# Patient Record
Sex: Female | Born: 1990 | Race: White | Hispanic: No | Marital: Single | Smoking: Former smoker
Health system: Southern US, Community
[De-identification: ages and names within clinical notes are randomized; demographics above are authoritative.]

## PROBLEM LIST (undated history)

## (undated) ENCOUNTER — Inpatient Hospital Stay (HOSPITAL_COMMUNITY): Payer: Self-pay

## (undated) DIAGNOSIS — N83209 Unspecified ovarian cyst, unspecified side: Secondary | ICD-10-CM

---

## 2013-04-16 NOTE — L&D Delivery Note (Signed)
Delivery Note At 11:15 AM a viable female, "Gwenevere Abbot" was delivered via  (Presentation:ROA;  ).  APGAR: , ; weight  .   Placenta status: Spontaneous, intact.  Cord:  with the following complications: None.  Cord pH: NA  Anesthesia: Epidural  Episiotomy:  NA Lacerations:  1st degree vaginal, 1st degree right inner labial laceration Suture Repair: 3.0 vicryl Est. Blood Loss (mL):  150 by drape graduation  Mom to postpartum.  Baby to Couplet care / Skin to Skin. Patient plans Depo before d/c.  Donnel Saxon 04/10/2014, 11:52 AM

## 2013-08-14 LAB — PREGNANCY, URINE

## 2013-10-29 LAB — OB RESULTS CONSOLE ABO/RH: RH TYPE: NEGATIVE

## 2013-10-29 LAB — OB RESULTS CONSOLE ANTIBODY SCREEN: Antibody Screen: NEGATIVE

## 2013-10-29 LAB — OB RESULTS CONSOLE GC/CHLAMYDIA
CHLAMYDIA, DNA PROBE: POSITIVE
Gonorrhea: NEGATIVE

## 2013-10-29 LAB — OB RESULTS CONSOLE HIV ANTIBODY (ROUTINE TESTING): HIV: NONREACTIVE

## 2013-10-29 LAB — OB RESULTS CONSOLE RUBELLA ANTIBODY, IGM: Rubella: IMMUNE

## 2013-10-29 LAB — OB RESULTS CONSOLE HEPATITIS B SURFACE ANTIGEN: Hepatitis B Surface Ag: NEGATIVE

## 2013-10-29 LAB — OB RESULTS CONSOLE RPR: RPR: NONREACTIVE

## 2013-11-20 ENCOUNTER — Telehealth: Payer: Self-pay | Admitting: Obstetrics and Gynecology

## 2013-11-20 ENCOUNTER — Encounter: Payer: Self-pay | Admitting: *Deleted

## 2013-11-20 ENCOUNTER — Encounter: Payer: Self-pay | Admitting: Nurse Practitioner

## 2013-11-20 NOTE — Telephone Encounter (Signed)
Called patient for a missed appt today. Pt stated that she have tried to cancel this appt via phone but didn't receive a confirmation call. She does not want to re-schedule for she is getting her prenatal care somewhere else.

## 2013-12-31 LAB — OB RESULTS CONSOLE RPR: RPR: NONREACTIVE

## 2014-01-14 LAB — OB RESULTS CONSOLE GC/CHLAMYDIA
CHLAMYDIA, DNA PROBE: NEGATIVE
GC PROBE AMP, GENITAL: NEGATIVE

## 2014-03-10 LAB — OB RESULTS CONSOLE GBS: STREP GROUP B AG: NEGATIVE

## 2014-03-30 ENCOUNTER — Encounter: Payer: Self-pay | Admitting: *Deleted

## 2014-04-09 ENCOUNTER — Inpatient Hospital Stay (HOSPITAL_COMMUNITY)
Admission: AD | Admit: 2014-04-09 | Discharge: 2014-04-09 | Disposition: A | Discharge status: Home / Self Care | Payer: Medicaid Other | Source: Ambulatory Visit | Attending: Obstetrics & Gynecology | Admitting: Obstetrics & Gynecology

## 2014-04-09 ENCOUNTER — Encounter (HOSPITAL_COMMUNITY): Payer: Self-pay | Admitting: *Deleted

## 2014-04-09 DIAGNOSIS — O99214 Obesity complicating childbirth: Principal | ICD-10-CM | POA: Diagnosis present

## 2014-04-09 DIAGNOSIS — E669 Obesity, unspecified: Secondary | ICD-10-CM | POA: Diagnosis present

## 2014-04-09 DIAGNOSIS — IMO0002 Reserved for concepts with insufficient information to code with codable children: Secondary | ICD-10-CM | POA: Diagnosis not present

## 2014-04-09 DIAGNOSIS — O9852 Other viral diseases complicating childbirth: Secondary | ICD-10-CM | POA: Diagnosis present

## 2014-04-09 DIAGNOSIS — Z6832 Body mass index (BMI) 32.0-32.9, adult: Secondary | ICD-10-CM

## 2014-04-09 DIAGNOSIS — O479 False labor, unspecified: Secondary | ICD-10-CM

## 2014-04-09 DIAGNOSIS — B007 Disseminated herpesviral disease: Secondary | ICD-10-CM | POA: Diagnosis present

## 2014-04-09 DIAGNOSIS — A6000 Herpesviral infection of urogenital system, unspecified: Secondary | ICD-10-CM | POA: Diagnosis not present

## 2014-04-09 DIAGNOSIS — Z3A40 40 weeks gestation of pregnancy: Secondary | ICD-10-CM | POA: Diagnosis present

## 2014-04-09 HISTORY — DX: Unspecified ovarian cyst, unspecified side: N83.209

## 2014-04-09 MED ORDER — MORPHINE SULFATE 10 MG/ML IJ SOLN
10.0000 mg | Freq: Once | INTRAMUSCULAR | Status: AC
  Administered 2014-04-09: 10 mg via INTRAMUSCULAR
  Filled 2014-04-09: qty 1

## 2014-04-09 MED ORDER — OXYCODONE-ACETAMINOPHEN 5-325 MG PO TABS: 1.0000 | ORAL_TABLET | Freq: Four times a day (QID) | ORAL | Status: DC | PRN

## 2014-04-09 NOTE — Discharge Instructions (Signed)
Braxton Hicks Contractions °Contractions of the uterus can occur throughout pregnancy. Contractions are not always a sign that you are in labor.  °WHAT ARE BRAXTON HICKS CONTRACTIONS?  °Contractions that occur before labor are called Braxton Hicks contractions, or false labor. Toward the end of pregnancy (32-34 weeks), these contractions can develop more often and may become more forceful. This is not true labor because these contractions do not result in opening (dilatation) and thinning of the cervix. They are sometimes difficult to tell apart from true labor because these contractions can be forceful and people have different pain tolerances. You should not feel embarrassed if you go to the hospital with false labor. Sometimes, the only way to tell if you are in true labor is for your health care provider to look for changes in the cervix. °If there are no prenatal problems or other health problems associated with the pregnancy, it is completely safe to be sent home with false labor and await the onset of true labor. °HOW CAN YOU TELL THE DIFFERENCE BETWEEN TRUE AND FALSE LABOR? °False Labor °· The contractions of false labor are usually shorter and not as hard as those of true labor.   °· The contractions are usually irregular.   °· The contractions are often felt in the front of the lower abdomen and in the groin.   °· The contractions may go away when you walk around or change positions while lying down.   °· The contractions get weaker and are shorter lasting as time goes on.   °· The contractions do not usually become progressively stronger, regular, and closer together as with true labor.   °True Labor °· Contractions in true labor last 30-70 seconds, become very regular, usually become more intense, and increase in frequency.   °· The contractions do not go away with walking.   °· The discomfort is usually felt in the top of the uterus and spreads to the lower abdomen and low back.   °· True labor can be  determined by your health care provider with an exam. This will show that the cervix is dilating and getting thinner.   °WHAT TO REMEMBER °· Keep up with your usual exercises and follow other instructions given by your health care provider.   °· Take medicines as directed by your health care provider.   °· Keep your regular prenatal appointments.   °· Eat and drink lightly if you think you are going into labor.   °· If Braxton Hicks contractions are making you uncomfortable:   °¨ Change your position from lying down or resting to walking, or from walking to resting.   °¨ Sit and rest in a tub of warm water.   °¨ Drink 2-3 glasses of water. Dehydration may cause these contractions.   °¨ Do slow and deep breathing several times an hour.   °WHEN SHOULD I SEEK IMMEDIATE MEDICAL CARE? °Seek immediate medical care if: °· Your contractions become stronger, more regular, and closer together.   °· You have fluid leaking or gushing from your vagina.   °· You have a fever.   °· You pass blood-tinged mucus.   °· You have vaginal bleeding.   °· You have continuous abdominal pain.   °· You have low back pain that you never had before.   °· You feel your baby's head pushing down and causing pelvic pressure.   °· Your baby is not moving as much as it used to.   °Document Released: 04/02/2005 Document Revised: 04/07/2013 Document Reviewed: 01/12/2013 °Exit[employer]® Patient Information ©2015 [employer]. This information is not intended to replace advice given to you by your health care   provider. Make sure you discuss any questions you have with your health care provider. ° °

## 2014-04-09 NOTE — MAU Provider Note (Signed)
History  23 yo G1P0 @ 40.4 wks presents to MAU unanounced w/ c/o worsening ctxs. Seen in MAU earlier today for same. Reports active fetus. Denies LOF or VB.  Patient Active Problem List   Diagnosis Date Noted  . 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 Rh negative status during pregnancy in third trimester 04/09/2014  . Chlamydia infection affecting pregnancy--dx 11/04/13, TOC negative 04/09/2014    No chief complaint on file.  HPI As above OB History    Gravida Para Term Preterm AB TAB SAB Ectopic Multiple Living   1               Past Medical History  Diagnosis Date  . Ovarian cyst     Past Surgical History  Procedure Laterality Date  . No past surgeries      Family History  Problem Relation Age of Onset  . Asthma Brother   . Diabetes Maternal Grandmother   . Hypertension Maternal Grandmother   . Stroke Maternal Grandmother   . Cancer Paternal Grandmother     breast    History  Substance Use Topics  . Smoking status: Former Research scientist (life sciences)  . Smokeless tobacco: Never Used     Comment: mid 2014  . Alcohol Use: No    Allergies: No Known Allergies  Prescriptions prior to admission  Medication Sig Dispense Refill Last Dose  . diphenhydramine-acetaminophen (TYLENOL PM) 25-500 MG TABS Take 2 tablets by mouth at bedtime as needed (sleep).   two weeks  . Prenatal Vit-Fe Fumarate-FA (PRENATAL MULTIVITAMIN) TABS tablet Take 1 tablet by mouth daily at 12 noon.   Past Month at Unknown time  . PRESCRIPTION MEDICATION Take 1 tablet by mouth daily. Pt reports taking daily Rx for unknown treatment.  Drug/strength unknown.   two weeks    ROS  Ctxs +FM Physical Exam   Blood pressure 126/80, pulse 93, temperature 97.9 F (36.6 C), resp. rate 18, last menstrual period 07/09/2013.  Physical Exam Gen: Mild distress Abdomen: gravid, soft between ctxs, non-tender, no guarding, no rebound  Pelvic: 3.5/70/-2 per RN ED Course  Assessment: Latent labor Cat 1  FHRT  Plan: Pt given option to ambulate or receive pain medication. She elected pain medication. Morphine Sulfate IM Rx'd. Will reevaluate cvx in 1 hr.   Farrel Gordon CNM, MS 04/09/2014 8:24 PM    ADDENDUM: Medication effective and pt reports ctxs are less intense and infrequent. Ctxs per toco q 7 min and palpate mild. Cat 1 FHRT remains and cervix unchanged. D/C'd home w/ strict labor precautions. Percocet tabs Rx'd. Pt scheduled for pm induction on Monday, 04/12/14, but invited to call/return sooner prn. Teaching re: 1st stage labor reinforced.  Farrel Gordon, CNM 04/09/14, 10:30 PM

## 2014-04-09 NOTE — MAU Provider Note (Signed)
History   23 yo G1P0 at 59 4/7 weeks presented unannounced c/o back pain and contractions x several hours.  Denies leaking or bleeding, reports +FM.  Denies HSV lesions or prodrome.  Membranes swept at visit on 04/07/14, cervix 3 cm, 60%, vtx, -3.  GBS negative.  Scheduled for induction for postdates 04/13/14.  Patient Active Problem List   Diagnosis Date Noted  . 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 Rh negative status during pregnancy in third trimester 04/09/2014  . Chlamydia infection affecting pregnancy--dx 11/04/13, TOC negative 04/09/2014    Chief Complaint  Patient presents with  . Labor Eval   HPI:  See above  OB History    Gravida Para Term Preterm AB TAB SAB Ectopic Multiple Living   1               Past Medical History  Diagnosis Date  . Ovarian cyst     Past Surgical History  Procedure Laterality Date  . No past surgeries      Family History  Problem Relation Age of Onset  . Asthma Brother   . Diabetes Maternal Grandmother   . Hypertension Maternal Grandmother   . Stroke Maternal Grandmother   . Cancer Paternal Grandmother     breast    History  Substance Use Topics  . Smoking status: Former Research scientist (life sciences)  . Smokeless tobacco: Never Used     Comment: mid 2014  . Alcohol Use: No    Allergies: Allergies not on file  No prescriptions prior to admission    ROS:  Back pain, cramping, +FM Physical Exam   Blood pressure 124/77, pulse 101, temperature 98.5 F (36.9 C), temperature source Oral, resp. rate 18, last menstrual period 07/09/2013.  Physical Exam  Chest clear Heart RRR without murmur Abd gravid, NT Pelvic--posterior, 3 cm, 50%, vtx, -3 CVAT negative Ext WNL  FHR Category 1 UCs very mild, irregular  ED Course  Assessment: IUP at 40 4/7 weeks Early vs prodromal labor Hx HSV--no recent or current lesions/prodrome GBS negative  Plan: Ambulate x 1 hour, then re-evaluate.    Donnel Saxon CNM, MSN 04/09/2014  12:54 PM  Addendum: Returned from walking.  Feeling some cramping in front and back, but does not appear any more uncomfortable. FHR Category 1 UCs irregular, mild. Cervix posterior, 3 cm, 60%, vtx, -2.  D/C home with labor precautions. Keep plan for induction on 12/28, unless labor ensues prior to that.  Donnel Saxon, CNM 04/09/14 3:05p

## 2014-04-09 NOTE — MAU Note (Signed)
Has been contracting for a few hours, back pain. No bleeding or leaking.

## 2014-04-10 ENCOUNTER — Inpatient Hospital Stay (HOSPITAL_COMMUNITY): Payer: Medicaid Other | Admitting: Anesthesiology

## 2014-04-10 ENCOUNTER — Inpatient Hospital Stay (HOSPITAL_COMMUNITY)
Admission: AD | Admit: 2014-04-10 | Discharge: 2014-04-12 | DRG: 774 | Disposition: A | Discharge status: Home / Self Care | Payer: Medicaid Other | Source: Ambulatory Visit | Attending: Obstetrics & Gynecology | Admitting: Obstetrics & Gynecology

## 2014-04-10 ENCOUNTER — Encounter (HOSPITAL_COMMUNITY): Payer: Self-pay

## 2014-04-10 DIAGNOSIS — B007 Disseminated herpesviral disease: Secondary | ICD-10-CM | POA: Diagnosis present

## 2014-04-10 DIAGNOSIS — E669 Obesity, unspecified: Secondary | ICD-10-CM | POA: Diagnosis present

## 2014-04-10 DIAGNOSIS — Z6832 Body mass index (BMI) 32.0-32.9, adult: Secondary | ICD-10-CM | POA: Diagnosis not present

## 2014-04-10 DIAGNOSIS — B009 Herpesviral infection, unspecified: Secondary | ICD-10-CM | POA: Diagnosis not present

## 2014-04-10 DIAGNOSIS — O9852 Other viral diseases complicating childbirth: Secondary | ICD-10-CM | POA: Diagnosis present

## 2014-04-10 DIAGNOSIS — Z3403 Encounter for supervision of normal first pregnancy, third trimester: Secondary | ICD-10-CM | POA: Diagnosis present

## 2014-04-10 DIAGNOSIS — Z3A40 40 weeks gestation of pregnancy: Secondary | ICD-10-CM | POA: Diagnosis present

## 2014-04-10 DIAGNOSIS — O99214 Obesity complicating childbirth: Secondary | ICD-10-CM | POA: Diagnosis present

## 2014-04-10 LAB — CBC
HEMATOCRIT: 35.5 % — AB (ref 36.0–46.0)
HEMOGLOBIN: 12.3 g/dL (ref 12.0–15.0)
MCH: 30.6 pg (ref 26.0–34.0)
MCHC: 34.6 g/dL (ref 30.0–36.0)
MCV: 88.3 fL (ref 78.0–100.0)
PLATELETS: 263 10*3/uL (ref 150–400)
RBC: 4.02 MIL/uL (ref 3.87–5.11)
RDW: 13 % (ref 11.5–15.5)
WBC: 14.5 10*3/uL — AB (ref 4.0–10.5)

## 2014-04-10 LAB — TYPE AND SCREEN
ABO/RH(D): B NEG
Antibody Screen: NEGATIVE

## 2014-04-10 LAB — ABO/RH: ABO/RH(D): B NEG

## 2014-04-10 LAB — RPR

## 2014-04-10 MED ORDER — ZOLPIDEM TARTRATE 5 MG PO TABS: 5.0000 mg | ORAL_TABLET | Freq: Every evening | ORAL | Status: DC | PRN

## 2014-04-10 MED ORDER — ONDANSETRON HCL 4 MG/2ML IJ SOLN: 4.0000 mg | Freq: Four times a day (QID) | INTRAMUSCULAR | Status: DC | PRN

## 2014-04-10 MED ORDER — PRENATAL MULTIVITAMIN CH
1.0000 | ORAL_TABLET | Freq: Every day | ORAL | Status: DC
  Administered 2014-04-10 – 2014-04-12 (×3): 1 via ORAL
  Filled 2014-04-10 (×3): qty 1

## 2014-04-10 MED ORDER — LIDOCAINE HCL (PF) 1 % IJ SOLN
30.0000 mL | INTRAMUSCULAR | Status: AC | PRN
  Administered 2014-04-10: 30 mL via SUBCUTANEOUS
  Filled 2014-04-10: qty 30

## 2014-04-10 MED ORDER — LACTATED RINGERS IV SOLN
INTRAVENOUS | Status: DC
  Administered 2014-04-10 (×2): via INTRAVENOUS

## 2014-04-10 MED ORDER — BENZOCAINE-MENTHOL 20-0.5 % EX AERO
1.0000 "application " | INHALATION_SPRAY | CUTANEOUS | Status: DC | PRN
  Administered 2014-04-10: 1 via TOPICAL
  Filled 2014-04-10: qty 56

## 2014-04-10 MED ORDER — LANOLIN HYDROUS EX OINT: TOPICAL_OINTMENT | CUTANEOUS | Status: DC | PRN

## 2014-04-10 MED ORDER — OXYTOCIN 40 UNITS IN LACTATED RINGERS INFUSION - SIMPLE MED
1.0000 m[IU]/min | INTRAVENOUS | Status: DC
  Administered 2014-04-10: 1 m[IU]/min via INTRAVENOUS

## 2014-04-10 MED ORDER — EPHEDRINE 5 MG/ML INJ
10.0000 mg | INTRAVENOUS | Status: DC | PRN
  Filled 2014-04-10: qty 2

## 2014-04-10 MED ORDER — CITRIC ACID-SODIUM CITRATE 334-500 MG/5ML PO SOLN: 30.0000 mL | ORAL | Status: DC | PRN

## 2014-04-10 MED ORDER — DIBUCAINE 1 % RE OINT: 1.0000 "application " | TOPICAL_OINTMENT | RECTAL | Status: DC | PRN

## 2014-04-10 MED ORDER — SENNOSIDES-DOCUSATE SODIUM 8.6-50 MG PO TABS
2.0000 | ORAL_TABLET | ORAL | Status: DC
  Administered 2014-04-11 (×2): 2 via ORAL
  Filled 2014-04-10 (×2): qty 2

## 2014-04-10 MED ORDER — OXYCODONE-ACETAMINOPHEN 5-325 MG PO TABS
1.0000 | ORAL_TABLET | ORAL | Status: DC | PRN
  Administered 2014-04-10 – 2014-04-11 (×2): 1 via ORAL
  Filled 2014-04-10 (×2): qty 1

## 2014-04-10 MED ORDER — LIDOCAINE HCL (PF) 1 % IJ SOLN
INTRAMUSCULAR | Status: DC | PRN
  Administered 2014-04-10 (×2): 4 mL

## 2014-04-10 MED ORDER — TETANUS-DIPHTH-ACELL PERTUSSIS 5-2.5-18.5 LF-MCG/0.5 IM SUSP: 0.5000 mL | Freq: Once | INTRAMUSCULAR | Status: DC

## 2014-04-10 MED ORDER — TERBUTALINE SULFATE 1 MG/ML IJ SOLN: 0.2500 mg | Freq: Once | INTRAMUSCULAR | Status: DC | PRN

## 2014-04-10 MED ORDER — OXYCODONE-ACETAMINOPHEN 5-325 MG PO TABS: 2.0000 | ORAL_TABLET | ORAL | Status: DC | PRN

## 2014-04-10 MED ORDER — ACETAMINOPHEN 325 MG PO TABS: 650.0000 mg | ORAL_TABLET | ORAL | Status: DC | PRN

## 2014-04-10 MED ORDER — SIMETHICONE 80 MG PO CHEW: 80.0000 mg | CHEWABLE_TABLET | ORAL | Status: DC | PRN

## 2014-04-10 MED ORDER — PHENYLEPHRINE 40 MCG/ML (10ML) SYRINGE FOR IV PUSH (FOR BLOOD PRESSURE SUPPORT)
80.0000 ug | PREFILLED_SYRINGE | INTRAVENOUS | Status: DC | PRN
  Filled 2014-04-10: qty 2

## 2014-04-10 MED ORDER — OXYCODONE-ACETAMINOPHEN 5-325 MG PO TABS: 1.0000 | ORAL_TABLET | ORAL | Status: DC | PRN

## 2014-04-10 MED ORDER — FENTANYL 2.5 MCG/ML BUPIVACAINE 1/10 % EPIDURAL INFUSION (WH - ANES)
14.0000 mL/h | INTRAMUSCULAR | Status: DC | PRN
  Administered 2014-04-10: 14 mL/h via EPIDURAL
  Filled 2014-04-10: qty 125

## 2014-04-10 MED ORDER — IBUPROFEN 600 MG PO TABS
600.0000 mg | ORAL_TABLET | Freq: Four times a day (QID) | ORAL | Status: DC
  Administered 2014-04-10 – 2014-04-12 (×9): 600 mg via ORAL
  Filled 2014-04-10 (×9): qty 1

## 2014-04-10 MED ORDER — LACTATED RINGERS IV SOLN: 500.0000 mL | INTRAVENOUS | Status: DC | PRN

## 2014-04-10 MED ORDER — DIPHENHYDRAMINE HCL 50 MG/ML IJ SOLN: 12.5000 mg | INTRAMUSCULAR | Status: DC | PRN

## 2014-04-10 MED ORDER — FENTANYL 2.5 MCG/ML BUPIVACAINE 1/10 % EPIDURAL INFUSION (WH - ANES)
INTRAMUSCULAR | Status: DC | PRN
  Administered 2014-04-10: 14 mL/h via EPIDURAL

## 2014-04-10 MED ORDER — FLEET ENEMA 7-19 GM/118ML RE ENEM: 1.0000 | ENEMA | RECTAL | Status: DC | PRN

## 2014-04-10 MED ORDER — LACTATED RINGERS IV SOLN
500.0000 mL | Freq: Once | INTRAVENOUS | Status: AC
  Administered 2014-04-10: 500 mL via INTRAVENOUS

## 2014-04-10 MED ORDER — OXYTOCIN 40 UNITS IN LACTATED RINGERS INFUSION - SIMPLE MED
62.5000 mL/h | INTRAVENOUS | Status: DC
  Filled 2014-04-10: qty 1000

## 2014-04-10 MED ORDER — ONDANSETRON HCL 4 MG PO TABS: 4.0000 mg | ORAL_TABLET | ORAL | Status: DC | PRN

## 2014-04-10 MED ORDER — NALBUPHINE HCL 10 MG/ML IJ SOLN
5.0000 mg | INTRAMUSCULAR | Status: DC | PRN
  Administered 2014-04-10: 5 mg via INTRAVENOUS
  Filled 2014-04-10: qty 1

## 2014-04-10 MED ORDER — DIPHENHYDRAMINE HCL 25 MG PO CAPS: 25.0000 mg | ORAL_CAPSULE | Freq: Four times a day (QID) | ORAL | Status: DC | PRN

## 2014-04-10 MED ORDER — WITCH HAZEL-GLYCERIN EX PADS: 1.0000 "application " | MEDICATED_PAD | CUTANEOUS | Status: DC | PRN

## 2014-04-10 MED ORDER — OXYTOCIN BOLUS FROM INFUSION
500.0000 mL | INTRAVENOUS | Status: DC
  Administered 2014-04-10: 500 mL via INTRAVENOUS

## 2014-04-10 MED ORDER — ONDANSETRON HCL 4 MG/2ML IJ SOLN
4.0000 mg | INTRAMUSCULAR | Status: DC | PRN
Start: 2014-04-10 — End: 2014-04-12

## 2014-04-10 MED ORDER — PHENYLEPHRINE 40 MCG/ML (10ML) SYRINGE FOR IV PUSH (FOR BLOOD PRESSURE SUPPORT)
80.0000 ug | PREFILLED_SYRINGE | INTRAVENOUS | Status: DC | PRN
  Filled 2014-04-10: qty 2
  Filled 2014-04-10: qty 10

## 2014-04-10 NOTE — Anesthesia Postprocedure Evaluation (Signed)
  Anesthesia Post-op Note  Patient: Medical sales representative  Procedure(s) Performed: * No procedures listed *  Patient Location: PACU and Mother/Baby  Anesthesia Type:Epidural  Level of Consciousness: awake, alert , oriented and patient cooperative  Airway and Oxygen Therapy: Patient Spontanous Breathing  Post-op Pain: mild  Post-op Assessment: Post-op Vital signs reviewed, Patient's Cardiovascular Status Stable, Pain level controlled, No headache, No backache and No residual motor weakness  Post-op Vital Signs: Reviewed and stable  Last Vitals:  Filed Vitals:   04/10/14 1500  BP: 112/64  Pulse: 88  Temp: 36.7 C  Resp: 18    Complications: No apparent anesthesia complications

## 2014-04-10 NOTE — Progress Notes (Signed)
  Subjective: Sleeping, yet easily aroused. Comfortable w/ epidural. Family at bedside.  Objective: BP 95/54 mmHg  Pulse 79  Resp 16  Ht 5\' 6"  (1.676 m)  Wt 201 lb (91.173 kg)  BMI 32.46 kg/m2  SpO2 96%  LMP 07/09/2013     FHT: BL 135 w/ moderate variability, +accels, occ early UC:   Unable to adequately trace via external; able to trace every 4-6 minutes in some areas of tracing, now every 5 min s/p IUPC placement SVE: 7/80-90/-2,-1, BBOW AROM'd at 06:19 AM, small amount of clear fluid IUPC placed w/o difficulty  Assessment:  IUP at 40.5 wks Active labor GBS neg Cat 1 FHRT  Plan: Dr. Alesia Richards updated on pt's admission and progress Anticipate progress and SVD  Farrel Gordon CNM 04/10/2014, 6:25 AM

## 2014-04-10 NOTE — Lactation Note (Signed)
This note was copied from the chart of Sharon Ulanda Episcopo. Lactation Consultation Note Initial visit at 6 hours of age.  MBU RN request assist for moms flat nipples.  Mom holding baby in blankets and reports baby was showing feeding cues and not sleepy.  Attempted cradle latch and encouraged cross cradle mom willing.  Baby didn't wake for feedings.  Demonstrated football hold and hand expressed drops of colostrum to baby's mouth.   Hand pump used to help evert flat nipples, breasts are compressible and baby maybe able to latch without use of shields.  Hand expressed and spoon fed about 3 mls of colostrum.  Encouraged mom to work on hand expression and hand pumping prior to latch.  [facility] LC resources given and discussed.  Encouraged to feed with early cues on demand.  Early newborn behavior discussed.  Mom to call for assist as needed.        Patient Name: Sharon Browning Today's Date: 04/10/2014 Reason for consult: Initial assessment   Maternal Data Has patient been taught Hand Expression?: Yes Does the patient have breastfeeding experience prior to this delivery?: No  Feeding Feeding Type: Breast Fed  LATCH Score/Interventions Latch: Too sleepy or reluctant, no latch achieved, no sucking elicited. Intervention(s): Skin to skin;Teach feeding cues;Waking techniques  Audible Swallowing: None (drops expressed to mouth) Intervention(s): Hand expression Intervention(s): Skin to skin  Type of Nipple: Flat Intervention(s): Hand pump  Comfort (Breast/Nipple): Soft / non-tender     Hold (Positioning): Assistance needed to correctly position infant at breast and maintain latch. Intervention(s): Skin to skin;Position options;Support Pillows;Breastfeeding basics reviewed  LATCH Score: 4  Lactation Tools Discussed/Used     Consult Status Consult Status: Follow-up Date: 04/11/14 Follow-up type: In-patient    Sharon Browning 04/10/2014, 5:56 PM

## 2014-04-10 NOTE — H&P (Signed)
Sharon Browning is a 23 y.o. female, G1P0 at 40.5 weeks, presenting in active labor. Seen in MAU twice yesterday for ctxs; cvx remained 3-4 cm despite IM pain medication and re-evaluation of cervix after 1-2 hrs. Pt sent home last night on po pain medication. She presented several hours later with worsening ctxs. Reports active fetus. Denies vaginal bleeding or LOF. +HSV 1 & 2. Denies recent outbreak(s) or prodromal sxs. On suppression since 02/10/14, at 32.2 wks.   Patient Active Problem List   Diagnosis Date Noted  . Normal labor 04/10/2014  . 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 Obesity 04/10/2014  . 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 Rh negative status during pregnancy in third trimester 04/09/2014  . Chlamydia infection affecting pregnancy--dx 11/04/13, TOC negative 04/09/2014    History of present pregnancy: Patient entered care at 16.4 weeks.   EDC of 04/05/14 was established by 18.0 wk ultrasound.   Anatomy scan: 18.0 weeks, with anatomy not well visualized due to fetal position and an anterior placenta.   Additional [place] evaluations: 22.2 wks: f/u anatomy - EFW 1 lb +/- 2 oz (480 g); 43rd%tile, vtx, anterior placenta, normal linear growth; DA not seen; all other anatomy seen. Cvx closed. 26.2 wks; f/u - EFW 2 lbs (919 g); 33.6%tile, vtx, anterior placenta, normal fluid, DA seen; anatomy complete. Significant prenatal events: 2nd trimester: Common discomforts of pregnancy; relieved w/ conservative measures; 3rd trimester: Reflux (resolved w/ Zantac), and other common discomforts of pregnancy; relieved w/ conservative measures. Started on HSV prophylaxis. No outbreak(s) or prodromal sxs this pregnancy. Evaluated in MAU on 04/09/14 for contractions - received IM Morphine while in MAU and d/c'd home w/ po Percocet. Last evaluation: Office on 04/07/14 by Dr. Wilma Flavin, cvx 3/60/-3; membranes swept.   OB History    Gravida Para Term Preterm AB TAB SAB  Ectopic Multiple Living   1              Past Medical History  Diagnosis Date  . Ovarian cyst    Past Surgical History  Procedure Laterality Date  . No past surgeries     Family History: family history includes Asthma in her brother; Cancer in her paternal grandmother; Diabetes in her maternal grandmother; Hypertension in her maternal grandmother; Stroke in her maternal grandmother.HTN in her maternal uncle and Alzheimer's in her great MGF. Social History:  reports that she has quit smoking. Her smoking use included Cigarettes. She smoked 0.00 packs per day. She has never used smokeless tobacco. She reports that she does not drink alcohol or use illicit drugs. Pt is a Caucasian female with a 12th grade education. She works as a Radiation protection practitioner. She is of the [place]. FOB, Otis Brace and pt's mother are present and supportive.  Received both flu and Tdap on 12/31/13.  Pt will accept blood/blood products in an emergency.  Previous Depo user; plans to use this method again.  Prenatal Transfer Tool  Maternal Diabetes: No Genetic Screening: Normal Maternal Ultrasounds/Referrals: Normal Fetal Ultrasounds or other Referrals:  None Maternal Substance Abuse:  No Significant Maternal Medications:  Meds include: Other: PNV, Valtrex, Zantac & Percocet prn Significant Maternal Lab Results: Lab values include: Group B Strep negative, Rh negative, Other: +HSV 1 & 2, +HPV  ROS: Contractions  Allergic to Triaminic Cough/Runny Nose (rxn: Moderate to severe respiratory distress)  Dilation: 6.5 Effacement (%): 90 Station: -2 Exam by:: Building services engineer Height 5\' 6"  (1.676 m), weight  201 lb (91.173 kg), last menstrual period 07/09/2013.  Chest clear Heart RRR without murmur Abd gravid, NT, FH CWD Pelvic: Cervical exam as above. Normal external genitalia, vulva, vagina, cervix - no lesions noted  Ext: 1+ pitting edema to lower extremities  FHR:  BL 150 w/ moderate  variability, +accels, occ early UCs: Q 6 min, palpate moderate   Prenatal labs: ABO, Rh: B/Negative/-- (07/16 0000)B negative (10/29/13) Antibody: Negative (07/16 0000)Neg (10/29/13) Rubella:   Immune (10/29/13) RPR: Nonreactive (09/17 0000)  HBsAg: Negative (07/16 0000)  HIV: Non-reactive (07/16 0000)  GBS: Negative (11/25 0000) Sickle cell/Hgb electrophoresis: NA Pap: Neg on 11/02/13, but high risk HPV GC: Neg on 11/02/13 & 01/14/14 Chlamydia: Positive on 11/02/13, neg on 01/14/14 Genetic screenings: Normal Glucola: Normal at 70 Other: BV noted on pap on 11/02/13. NOB Hbg 12.1, 11.5 at 28 wks. Rhogam injection given on 01/20/2014   Assessment/Plan: IUP at 40.5 wks Active labor Cat 1 FHRT GBS neg Rh neg HSV 1&2 on prophylaxis since 32.2 wks Obesity   Plan: Admit to BS Routine [facility] orders IV pain med or Epidural as desired Anticipate progress and SVD   Laurey Morale, MS 04/10/2014, 4:01 AM

## 2014-04-10 NOTE — Progress Notes (Signed)
  Subjective: Very comfortable--dozing at intervals.  Objective: BP 112/83 mmHg  Pulse 83  Temp(Src) 98.3 F (36.8 C) (Oral)  Resp 20  Ht 5\' 6"  (1.676 m)  Wt 201 lb (91.173 kg)  BMI 32.46 kg/m2  SpO2 96%  LMP 07/09/2013      FHT: Category 1 UC:   irregular, every 2-4 minutes, some coupling SVE:   Initially small anterior lip, reduced with exam, now complete, vtx, 0 station. MVUs 180-230, but frequent coupling   Assessment:  2nd stage labor Mildly dysfunctional UC pattern GBS negative  Plan: Start pitocin for gentle nudge to more appropriate pattern. Position to facilitate rotation/descent. Discussed "laboring down" until urge to push, but with expectations of further descent.  Donnel Saxon CNM 04/10/2014, 8:59 AM

## 2014-04-10 NOTE — Anesthesia Preprocedure Evaluation (Signed)
Anesthesia Evaluation  Patient identified by MRN, date of birth, ID band Patient awake    Reviewed: Allergy & Precautions, H&P , NPO status , Patient's Chart, lab work & pertinent test results  History of Anesthesia Complications Negative for: history of anesthetic complications  Airway Mallampati: III  TM Distance: >3 FB Neck ROM: Full    Dental no notable dental hx. (+) Dental Advisory Given   Pulmonary former smoker,  breath sounds clear to auscultation  Pulmonary exam normal       Cardiovascular negative cardio ROS  Rhythm:Regular Rate:Normal     Neuro/Psych negative neurological ROS  negative psych ROS   GI/Hepatic negative GI ROS, Neg liver ROS,   Endo/Other  negative endocrine ROS  Renal/GU negative Renal ROS  negative genitourinary   Musculoskeletal negative musculoskeletal ROS (+)   Abdominal   Peds negative pediatric ROS (+)  Hematology negative hematology ROS (+)   Anesthesia Other Findings   Reproductive/Obstetrics (+) Pregnancy                             Anesthesia Physical Anesthesia Plan  ASA: II  Anesthesia Plan: Epidural   Post-op Pain Management:    Induction:   Airway Management Planned:   Additional Equipment:   Intra-op Plan:   Post-operative Plan:   Informed Consent: I have reviewed the patients History and Physical, chart, labs and discussed the procedure including the risks, benefits and alternatives for the proposed anesthesia with the patient or authorized representative who has indicated his/her understanding and acceptance.   Dental advisory given  Plan Discussed with: CRNA  Anesthesia Plan Comments:         Anesthesia Quick Evaluation

## 2014-04-10 NOTE — Anesthesia Procedure Notes (Signed)
Epidural Patient location during procedure: OB Start time: 04/10/2014 4:18 AM End time: 04/10/2014 4:23 AM  Staffing Anesthesiologist: Milana Obey Performed by: anesthesiologist   Preanesthetic Checklist Completed: patient identified, site marked, surgical consent, pre-op evaluation, timeout performed, IV checked, risks and benefits discussed and monitors and equipment checked  Epidural Patient position: sitting Prep: site prepped and draped and DuraPrep Patient monitoring: continuous pulse ox and blood pressure Approach: midline Location: L3-L4 Injection technique: LOR saline  Needle:  Needle type: Tuohy  Needle gauge: 17 G Needle length: 9 cm and 9 Needle insertion depth: 7 cm Catheter type: closed end flexible Catheter size: 19 Gauge Catheter at skin depth: 12 cm Test dose: negative  Assessment Events: blood not aspirated, injection not painful, no injection resistance, negative IV test and no paresthesia

## 2014-04-10 NOTE — MAU Note (Signed)
Report called to [facility] in [place]. Pt to 163 in bed.

## 2014-04-10 NOTE — Progress Notes (Signed)
  Subjective: Resting comfortably.  FOB and mother at bedside.  Objective: BP 109/88 mmHg  Pulse 86  Temp(Src) 98 F (36.7 C) (Oral)  Resp 20  Ht 5\' 6"  (1.676 m)  Wt 201 lb (91.173 kg)  BMI 32.46 kg/m2  SpO2 96%  LMP 07/09/2013      Filed Vitals:   04/10/14 0602 04/10/14 0632 04/10/14 0704 04/10/14 0732  BP: 95/54 99/63 121/81 109/88  Pulse: 79 90 85 86  Temp:  98 F (36.7 C)    TempSrc:  Oral    Resp: 16 16  20   Height:      Weight:      SpO2:        FHT: Category 1 UC:   regular, every 4 minutes SVE:  Deferred at present--last exam 0619, 7 cm, 80-90%, vtx, -1/-2 MVUs 130  Assessment:  Active labor GBS negative Rh negative HSV 1 and 2--no recent/current lesions or prodrome  Plan: Continue observation--recheck cervix around 0830. Augment prn if no advancement of labor.  Donnel Saxon CNM 04/10/2014, 8:06 AM

## 2014-04-11 LAB — CBC
HEMATOCRIT: 31.9 % — AB (ref 36.0–46.0)
Hemoglobin: 10.6 g/dL — ABNORMAL LOW (ref 12.0–15.0)
MCH: 30.2 pg (ref 26.0–34.0)
MCHC: 33.2 g/dL (ref 30.0–36.0)
MCV: 90.9 fL (ref 78.0–100.0)
Platelets: 216 10*3/uL (ref 150–400)
RBC: 3.51 MIL/uL — ABNORMAL LOW (ref 3.87–5.11)
RDW: 13.5 % (ref 11.5–15.5)
WBC: 10.5 10*3/uL (ref 4.0–10.5)

## 2014-04-11 MED ORDER — RHO D IMMUNE GLOBULIN 1500 UNIT/2ML IJ SOSY
300.0000 ug | PREFILLED_SYRINGE | Freq: Once | INTRAMUSCULAR | Status: AC
  Administered 2014-04-11: 300 ug via INTRAMUSCULAR
  Filled 2014-04-11: qty 2

## 2014-04-11 NOTE — Progress Notes (Addendum)
Sharon Browning   Subjective: Post Partum Day 1 Vaginal delivery, 1st degree vaginal, 1st degree right inner labial laceration Patient up ad lib, denies syncope or dizziness. Reports consuming regular diet without issues and denies N/V No issues with urination and reports bleeding is appropriate  Feeding:  breast Contraceptive plan:   depo  Objective: Temp:  [97.9 F (36.6 C)-98.3 F (36.8 C)] 97.9 F (36.6 C) (12/27 0603) Pulse Rate:  [73-105] 73 (12/27 0603) Resp:  [18-20] 18 (12/26 1828) BP: (84-133)/(51-92) 106/65 mmHg (12/27 0603) SpO2:  [100 %] 100 % (12/27 0603)  Physical Exam:  General: alert and cooperative Ext: WNL, no edema. No evidence of DVT seen on physical exam. Breast: Soft filling Lungs: CTAB Heart RRR without murmur  Abdomen:  Soft, fundus firm, lochia scant, + bowel sounds, non distended, non tender Lochia: appropriate Uterine Fundus: firm Laceration: healing well    Recent Labs  04/10/14 0345 04/11/14 0557  HGB 12.3 10.6*  HCT 35.5* 31.9*    Assessment S/P Vaginal Delivery-Day 1 Stable  Normal Involution Breastfeeding  Plan: Continue current care Plan for discharge tomorrow, Lactation consult and Contraception Depo prior to DC Lactation support Rhogam per protocol  Lailanie Hasley, CNM, MSN 04/11/2014, 7:35 AM

## 2014-04-11 NOTE — Lactation Note (Addendum)
This note was copied from the chart of Sharon Browning. Lactation Consultation Note Follow up visit at 32 hours of age.  Mom reports baby just fed for about 5 minutes and is too sleepy to continue.  DEBP set up in room, mom has not pumped yet.  Attempted latch assist baby not willing to suck. Mom was given a #20 NS. MOm attempts to apply, but has long finger nails and its difficult for her.   Baby has heart shaped tongue and short frenulum visible near tip of toungue.  Encouraged mom to discuss further with peds.  Instructed on DEBP use and encouraged hand expression.  Mom to give back EBM to baby if she is collecting.  Will provide mom with foley cup for feedings as needed.  Encouraged mom to feed with early cues.  Baby has had 2 voids and 5 stools today.  Mom to call for assist as needed.     Patient Name: Sharon Browning [id number] Date: 04/11/2014 Reason for consult: Follow-up assessment;Difficult latch   Maternal Data Has patient been taught Hand Expression?: Yes  Feeding Feeding Type: Breast Fed Length of feed: 5 min  LATCH Score/Interventions Latch: Too sleepy or reluctant, no latch achieved, no sucking elicited. Intervention(s): Skin to skin;Teach feeding cues;Waking techniques Intervention(s): Breast massage;Breast compression  Audible Swallowing: None Intervention(s): Skin to skin  Type of Nipple: Flat Intervention(s): Double electric pump;Hand pump  Comfort (Breast/Nipple): Soft / non-tender     Hold (Positioning): Assistance needed to correctly position infant at breast and maintain latch. Intervention(s): Skin to skin;Position options;Support Pillows;Breastfeeding basics reviewed  LATCH Score: 4  Lactation Tools Discussed/Used     Consult Status Consult Status: Follow-up Date: 04/12/14 Follow-up type: In-patient    Shoptaw, Justine Null 04/11/2014, 7:21 PM

## 2014-04-12 ENCOUNTER — Inpatient Hospital Stay (HOSPITAL_COMMUNITY): Admission: RE | Admit: 2014-04-12 | Payer: Medicaid Other | Source: Ambulatory Visit

## 2014-04-12 LAB — RH IG WORKUP (INCLUDES ABO/RH)
ABO/RH(D): B NEG
FETAL SCREEN: NEGATIVE
Gestational Age(Wks): 40.5
UNIT DIVISION: 0

## 2014-04-12 MED ORDER — OXYCODONE-ACETAMINOPHEN 5-325 MG PO TABS: 1.0000 | ORAL_TABLET | ORAL | Status: DC | PRN

## 2014-04-12 MED ORDER — IBUPROFEN 600 MG PO TABS: 600.0000 mg | ORAL_TABLET | Freq: Four times a day (QID) | ORAL | Status: DC | PRN

## 2014-04-12 MED ORDER — MEDROXYPROGESTERONE ACETATE 150 MG/ML IM SUSP
150.0000 mg | Freq: Once | INTRAMUSCULAR | Status: AC
  Administered 2014-04-12: 150 mg via INTRAMUSCULAR
  Filled 2014-04-12: qty 1

## 2014-04-12 NOTE — Discharge Summary (Signed)
  Vaginal Delivery Discharge Summary  Sharon Browning  DOB:    July 06, 1990 MRN:    [id number] CSN:    [id number]  Date of admission:                  04/10/14  Date of discharge:                   04/12/14  Procedures this admission:   SVB, repair of 1st degree vaginal and right inner labial lacerations  Date of Delivery: 04/10/14  Newborn Data:  Live born female  Birth Weight: 6 lb 12 oz (3062 g) APGAR: 9, 9  Home with mother. Name: Sharon Browning   History of Present Illness:  Ms. Sharon Browning is a 23 y.o. female, G1P1001, who presents at 103w5d weeks gestation. The patient has been followed at the [facility] and Gynecology division of [facility]. She was admitted onset of labor. Her pregnancy has been complicated by:  Patient Active Problem List   Diagnosis Date Noted  . 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 Obesity 04/10/2014  . Vaginal delivery 04/10/2014  . 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 Rh negative status during pregnancy in third trimester 04/09/2014  . Chlamydia infection affecting pregnancy--dx 11/04/13, TOC negative 04/09/2014     Hospital Course:  Admitted 04/10/14 in early labor, after several MAU evaluations for latent phase labor.  Negative GBS. Progressed with minimal augmentation in 2nd stage. Utilized epidural for pain management.  Delivery was performed by Donnel Saxon, CNM, without complication. Patient and baby tolerated the procedure without difficulty, with 1st degree vaginal and right inner labial  lacerations noted. Infant status was stable and remained in room with mother.  Mother and infant then had an uncomplicated postpartum course, with breast feeding going well. Mom's physical exam was WNL, and she was discharged home in stable condition. Contraception plan was Deoo Provera on d/c.  She received adequate benefit from po pain medications, using Motrin and Percocet with  benefit.  She received Rhophylac prior to d/c.   Feeding:  breast  Contraception:  Depo-Provera  Discharge hemoglobin:  HEMOGLOBIN  Date Value Ref Range Status  04/11/2014 10.6* 12.0 - 15.0 g/dL Final   HCT  Date Value Ref Range Status  04/11/2014 31.9* 36.0 - 46.0 % Final    Discharge Physical Exam:   General: alert Lochia: appropriate Uterine Fundus: firm Incision: healing well DVT Evaluation: No evidence of DVT seen on physical exam. Negative Homan's sign.  Intrapartum Procedures: spontaneous vaginal delivery Postpartum Procedures: Rho(D) Ig, Depo Provera Complications-Operative and Postpartum: 1st degree vaginal laceration and right inner labial laceration  Discharge Diagnoses: Term Pregnancy-delivered and Rh negative  Discharge Information:  Activity:           pelvic rest Diet:                routine Medications: Ibuprofen and Percocet Condition:      stable Instructions:     Discharge to: home  Follow-up Information    Follow up with [facility]. Schedule an appointment as soon as possible for a visit in 6 weeks.   Specialty:  Obstetrics and Gynecology   Why:  Call for any questions or concerns.   Contact information:   [address] [place] [id number] [phone]       Donnel Saxon [facility] 04/12/2014 7:59 AM

## 2014-04-12 NOTE — Progress Notes (Signed)
Ur chart review completed.  

## 2014-04-12 NOTE — Discharge Instructions (Signed)

## 2014-04-12 NOTE — Lactation Note (Signed)
This note was copied from the chart of Sharon Browning. Lactation Consultation Note Mom initiated formula.  She reports that baby eats for 30 minutes and begins to root shortly after coming off of the breast.  She also reports that her nipples are tender.  Previous IBCLC assessed that baby has a heart shaped tongue and I agree with this assessment. Mom is not using NS as she is able to latch baby without it.  Encouraged her to continue pumping to help her milk come to volume.  Also encouraged OP appointment if supplementation continues to be necessary to assess for milk transfer at the breast. Patient Name: Sharon Browning Today's Date: 04/12/2014     Maternal Data    Feeding Feeding Type: Formula Nipple Type: Slow - flow  LATCH Score/Interventions                      Lactation Tools Discussed/Used     Consult Status      Van Clines 04/12/2014, 11:00 AM

## 2015-06-20 ENCOUNTER — Emergency Department (HOSPITAL_BASED_OUTPATIENT_CLINIC_OR_DEPARTMENT_OTHER): Payer: Self-pay

## 2015-06-20 ENCOUNTER — Encounter (HOSPITAL_BASED_OUTPATIENT_CLINIC_OR_DEPARTMENT_OTHER): Payer: Self-pay | Admitting: Emergency Medicine

## 2015-06-20 DIAGNOSIS — Z87891 Personal history of nicotine dependence: Secondary | ICD-10-CM | POA: Insufficient documentation

## 2015-06-20 DIAGNOSIS — Z79899 Other long term (current) drug therapy: Secondary | ICD-10-CM | POA: Insufficient documentation

## 2015-06-20 DIAGNOSIS — Z3A09 9 weeks gestation of pregnancy: Secondary | ICD-10-CM | POA: Insufficient documentation

## 2015-06-20 DIAGNOSIS — O360112 Maternal care for anti-D [Rh] antibodies, first trimester, fetus 2: Secondary | ICD-10-CM | POA: Insufficient documentation

## 2015-06-20 DIAGNOSIS — O3481 Maternal care for other abnormalities of pelvic organs, first trimester: Secondary | ICD-10-CM | POA: Insufficient documentation

## 2015-06-20 DIAGNOSIS — O200 Threatened abortion: Secondary | ICD-10-CM | POA: Insufficient documentation

## 2015-06-20 DIAGNOSIS — N858 Other specified noninflammatory disorders of uterus: Secondary | ICD-10-CM | POA: Insufficient documentation

## 2015-06-20 LAB — URINALYSIS, ROUTINE W REFLEX MICROSCOPIC
Bilirubin Urine: NEGATIVE
Glucose, UA: NEGATIVE mg/dL
KETONES UR: NEGATIVE mg/dL
Leukocytes, UA: NEGATIVE
Nitrite: NEGATIVE
Protein, ur: NEGATIVE mg/dL
Specific Gravity, Urine: 1.005 (ref 1.005–1.030)
pH: 6 (ref 5.0–8.0)

## 2015-06-20 LAB — URINE MICROSCOPIC-ADD ON

## 2015-06-20 LAB — PREGNANCY, URINE: PREG TEST UR: POSITIVE — AB

## 2015-06-20 NOTE — ED Notes (Signed)
Patient states that she found out last week that she was pregnant. The patient reports that she will need a Rhogam shot, and talked with her OB - patient states that they told her not to worry about that until later in pregnancy unless she had pain or spotting. Patient states that she is now bleeding and having abdominal cramping.

## 2015-06-21 ENCOUNTER — Emergency Department (HOSPITAL_BASED_OUTPATIENT_CLINIC_OR_DEPARTMENT_OTHER)
Admission: EM | Admit: 2015-06-21 | Discharge: 2015-06-21 | Disposition: A | Discharge status: Home / Self Care | Payer: Self-pay | Attending: Emergency Medicine | Admitting: Emergency Medicine

## 2015-06-21 DIAGNOSIS — N939 Abnormal uterine and vaginal bleeding, unspecified: Secondary | ICD-10-CM

## 2015-06-21 DIAGNOSIS — O200 Threatened abortion: Secondary | ICD-10-CM

## 2015-06-21 DIAGNOSIS — N949 Unspecified condition associated with female genital organs and menstrual cycle: Secondary | ICD-10-CM

## 2015-06-21 DIAGNOSIS — O360112 Maternal care for anti-D [Rh] antibodies, first trimester, fetus 2: Secondary | ICD-10-CM

## 2015-06-21 LAB — HCG, QUANTITATIVE, PREGNANCY: HCG, BETA CHAIN, QUANT, S: 7362 m[IU]/mL — AB (ref <5)

## 2015-06-21 LAB — GC/CHLAMYDIA PROBE AMP (~~LOC~~) NOT AT ARMC
CHLAMYDIA, DNA PROBE: NEGATIVE
Neisseria Gonorrhea: NEGATIVE

## 2015-06-21 MED ORDER — RHO D IMMUNE GLOBULIN 1500 UNIT/2ML IJ SOSY
PREFILLED_SYRINGE | INTRAMUSCULAR | Status: AC
  Filled 2015-06-21: qty 2

## 2015-06-21 MED ORDER — RHO D IMMUNE GLOBULIN 1500 UNIT/2ML IJ SOSY
300.0000 ug | PREFILLED_SYRINGE | Freq: Once | INTRAMUSCULAR | Status: AC
  Administered 2015-06-21: 300 ug via INTRAMUSCULAR

## 2015-06-21 NOTE — ED Provider Notes (Signed)
CSN: [id number]     Arrival date & time 06/20/15  2012 History  By signing my name below, I, Sharon Browning, attest that this documentation has been prepared under the direction and in the presence of [name], MD. Electronically Signed: Helane Browning, ED Scribe. 06/21/2015. 12:46 AM.      Chief Complaint  Patient presents with  . Vaginal Bleeding   The history is provided by the patient. No language interpreter was used.   HPI Comments: Sharon Browning is a 25 y.o. female who is approximately [redacted] weeks pregnant. She presents to the Emergency Department complaining of vaginal bleeding onset 5.5 hours ago. Pt states this began with mild abdominal cramping and bright red bleeding. She notes the bleeding has progressively worsened, but is still lighter than when she has her period. She notes her blood type is O-negative and that she has not yet received a Rhogam shot. Her OBGYN is at [facility], but she has not yet been seen for the pregnancy, though she did consult with them over the phone. She was told she would not need the shot until later in the pregnancy, unless she had bleeding which began yesterday as noted. Pt denies n/v/d.   Past Medical History  Diagnosis Date  . Ovarian cyst    Past Surgical History  Procedure Laterality Date  . No past surgeries     Family History  Problem Relation Age of Onset  . Asthma Brother   . Diabetes Maternal Grandmother   . Hypertension Maternal Grandmother   . Stroke Maternal Grandmother   . Cancer Paternal Grandmother     breast   Social History  Substance Use Topics  . Smoking status: Former Smoker    Types: Cigarettes  . Smokeless tobacco: Never Used     Comment: mid 2014  . Alcohol Use: No   OB History    Gravida Para Term Preterm AB TAB SAB Ectopic Multiple Living   1 1 1       0 1     Review of Systems  All other systems reviewed and are negative.   Allergies  Triaminic cold-cough  Home Medications   Prior to  Admission medications   Medication Sig Start Date End Date Taking? Authorizing Provider  Prenatal Vit-Fe Fumarate-FA (PRENATAL MULTIVITAMIN) TABS tablet Take 1 tablet by mouth daily at 12 noon.    Historical Provider, MD   BP 127/83 mmHg  Pulse 84  Temp(Src) 98.1 F (36.7 C) (Oral)  Resp 18  Ht 5\' 6"  (1.676 m)  Wt 180 lb (81.647 kg)  BMI 29.07 kg/m2  SpO2 100%  LMP 04/25/2015   Physical Exam  General: Well-developed, well-nourished female in no acute distress; appearance consistent with age of record HENT: normocephalic; atraumatic Eyes: pupils equal, round and reactive to light; extraocular muscles intact Neck: supple Heart: regular rate and rhythm Lungs: clear to auscultation bilaterally Abdomen: soft; nondistended; nontender; no masses or hepatosplenomegaly; bowel sounds present GU: normal external genitalia; dark red blood in vault; cervical os closed; no cervical motion tenderness Extremities: No deformity; full range of motion; pulses normal Neurologic: Awake, alert and oriented; motor function intact in all extremities and symmetric; no facial droop Skin: Warm and dry Psychiatric: Normal mood and affect    ED Course  Procedures    MDM  Rhophylac 300 micrograms given IM.  Final diagnoses:  Threatened miscarriage  Rh negative state in antepartum period, first trimester, fetus 2  Adnexal cyst   I personally performed the services  described in this documentation, which was scribed in my presence. The recorded information has been reviewed and is accurate.   [name], MD 06/21/15 [phone]

## 2015-06-21 NOTE — ED Notes (Signed)
Pt states she was seen at the HD earlier today and was told she was 9 weeks preg. She is also O Neg and they told her she would need Rhogam. G2P1

## 2015-06-22 ENCOUNTER — Encounter (HOSPITAL_COMMUNITY): Payer: Self-pay | Admitting: *Deleted

## 2015-06-22 ENCOUNTER — Inpatient Hospital Stay (HOSPITAL_COMMUNITY)
Admission: AD | Admit: 2015-06-22 | Discharge: 2015-06-22 | Disposition: A | Discharge status: Home / Self Care | Payer: Self-pay | Source: Ambulatory Visit | Attending: Obstetrics and Gynecology | Admitting: Obstetrics and Gynecology

## 2015-06-22 ENCOUNTER — Inpatient Hospital Stay (HOSPITAL_COMMUNITY): Payer: Self-pay

## 2015-06-22 DIAGNOSIS — Z6791 Unspecified blood type, Rh negative: Secondary | ICD-10-CM | POA: Diagnosis present

## 2015-06-22 DIAGNOSIS — Z3A01 Less than 8 weeks gestation of pregnancy: Secondary | ICD-10-CM | POA: Insufficient documentation

## 2015-06-22 DIAGNOSIS — N83202 Unspecified ovarian cyst, left side: Secondary | ICD-10-CM | POA: Insufficient documentation

## 2015-06-22 DIAGNOSIS — O26899 Other specified pregnancy related conditions, unspecified trimester: Secondary | ICD-10-CM

## 2015-06-22 DIAGNOSIS — Z87891 Personal history of nicotine dependence: Secondary | ICD-10-CM | POA: Insufficient documentation

## 2015-06-22 DIAGNOSIS — O039 Complete or unspecified spontaneous abortion without complication: Secondary | ICD-10-CM | POA: Insufficient documentation

## 2015-06-22 DIAGNOSIS — E669 Obesity, unspecified: Secondary | ICD-10-CM | POA: Insufficient documentation

## 2015-06-22 DIAGNOSIS — N939 Abnormal uterine and vaginal bleeding, unspecified: Secondary | ICD-10-CM

## 2015-06-22 LAB — HCG, QUANTITATIVE, PREGNANCY: hCG, Beta Chain, Quant, S: 1581 m[IU]/mL — ABNORMAL HIGH (ref <5)

## 2015-06-22 NOTE — MAU Note (Signed)
Pt presents to MAU with complaints of vaginal bleeding. + home pregnancy test and confirmed by health department on March the 3rd. Reports lower abdominal cramping.

## 2015-06-22 NOTE — MAU Provider Note (Signed)
History   25 yo G2P1001 in early pregnancy presented after calling office to report on-going bleeding and cramping.  Seen at ER 06/21/15 for same complaint, with [id number], negative GC/chlamydia, and US showing SIUP of 5 6/7 weeks, with FHR present.  Large complex mass in the left adnexum measuring up to 6.2 cm diameter suggesting a dermoid cyst.  She received Rhophylac at that visit.  Patient reports bleeding was moderately heavy at the time of that visit, and had continued today, with moderate cramping.  Patient Active Problem List   Diagnosis Date Noted  . Rh negative state in antepartum period 06/22/2015  . 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 Obesity 04/10/2014  . 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 HPV test positive 04/09/2014    Chief Complaint  Patient presents with  . Vaginal Bleeding   HPI:  As above  OB History    Gravida Para Term Preterm AB TAB SAB Ectopic Multiple Living   2 1 1       0 1      Past Medical History  Diagnosis Date  . Ovarian cyst     Past Surgical History  Procedure Laterality Date  . No past surgeries      Family History  Problem Relation Age of Onset  . Asthma Brother   . Diabetes Maternal Grandmother   . Hypertension Maternal Grandmother   . Stroke Maternal Grandmother   . Cancer Paternal Grandmother     breast    Social History  Substance Use Topics  . Smoking status: Former Smoker    Types: Cigarettes  . Smokeless tobacco: Never Used     Comment: mid 2014  . Alcohol Use: No    Allergies:  Allergies  Allergen Reactions  . Triaminic Cold-Cough [Phenyleph-Diphenhydramine-Dm] Anaphylaxis    Pt can tolerate Acetaminophen. States allergy to Triaminic in pink bottle  . Sulfur Hives    Prescriptions prior to admission  Medication Sig Dispense Refill Last Dose  . Prenatal Vit-Fe Fumarate-FA (PRENATAL MULTIVITAMIN) TABS tablet Take 1 tablet by mouth daily at 12 noon.    06/22/2015 at Unknown time    ROS:  Vaginal bleeding, mild cramping Physical Exam   Blood pressure 134/57, pulse 95, temperature 98.1 F (36.7 C), resp. rate 18, height 5\' 4"  (1.626 m), weight 81.647 kg (180 lb), last menstrual period 04/25/2015, unknown if currently breastfeeding.    Physical Exam  In NAD Chest clear Heart RRR without murmur Abd soft, NT Pelvic--small amount dark blood in vault, cervix closed/long Ext WNL  ED Course  Assessment: 1st trimester bleeding Rh negative--received Rhophylac 06/21/15 Dermoid cyst  Plan: [place] for viability   Donnel Saxon CNM, MSN 06/22/2015 6:14 PM   Addendum:  [place] results: IMPRESSION: Findings suggest failed gestation, as the gestational sac has vanished and blood distends the endometrial canal.  Left ovarian mass (7 cm) suggesting dermoid again identified. On a nonemergent basis, this mass could be characterized more definitively with pelvic MRI.   Reviewed findings of complete SAB with patient and her mother. Support offered, options reviewed. I recommended following QHCGs weekly until resolved, in light of likely complete SAB. Will check QHCG and serum progesterone today. I will f/u with patient tomorrow regarding results, and will coordinate plan for weekly QHCG until resolved.  Donnel Saxon, CNM 06/22/15 5:20p  Addendum: Results for orders placed or performed during the hospital encounter of 06/22/15 (from the past 24 hour(s))  hCG, quantitative, pregnancy  Status: Abnormal   Collection Time: 06/22/15  6:23 PM  Result Value Ref Range   hCG, Beta Chain, Quant, S 1581 (H) <5 mIU/mL  Progesterone     Status: None   Collection Time: 06/22/15  6:23 PM  Result Value Ref Range   Progesterone 1.2 ng/mL   Will plan progesterone supplementation with onset of next pregnancy.  Donnel Saxon, CNM 06/22/15 8p

## 2015-06-22 NOTE — MAU Note (Signed)
Urine sent to lab 

## 2015-06-22 NOTE — Discharge Instructions (Signed)
Miscarriage  A miscarriage is the sudden loss of an unborn baby (fetus) before the 20th week of pregnancy. Most miscarriages happen in the first 3 months of pregnancy. Sometimes, it happens before a woman even knows she is pregnant. A miscarriage is also called a "spontaneous miscarriage" or "early pregnancy loss." Having a miscarriage can be an emotional experience. Talk with your caregiver about any questions you may have about miscarrying, the grieving process, and your future pregnancy plans.  CAUSES    Problems with the fetal chromosomes that make it impossible for the baby to develop normally. Problems with the baby's genes or chromosomes are most often the result of errors that occur, by chance, as the embryo divides and grows. The problems are not inherited from the parents.   Infection of the cervix or uterus.    Hormone problems.    Problems with the cervix, such as having an incompetent cervix. This is when the tissue in the cervix is not strong enough to hold the pregnancy.    Problems with the uterus, such as an abnormally shaped uterus, uterine fibroids, or congenital abnormalities.    Certain medical conditions.    Smoking, drinking alcohol, or taking illegal drugs.    Trauma.   Often, the cause of a miscarriage is unknown.   SYMPTOMS    Vaginal bleeding or spotting, with or without cramps or pain.   Pain or cramping in the abdomen or lower back.   Passing fluid, tissue, or blood clots from the vagina.  DIAGNOSIS   Your caregiver will perform a physical exam. You may also have an ultrasound to confirm the miscarriage. Blood or urine tests may also be ordered.  TREATMENT    Sometimes, treatment is not necessary if you naturally pass all the fetal tissue that was in the uterus. If some of the fetus or placenta remains in the body (incomplete miscarriage), tissue left behind may become infected and must be removed. Usually, a dilation and curettage (D and C) procedure is performed.  During a D and C procedure, the cervix is widened (dilated) and any remaining fetal or placental tissue is gently removed from the uterus.   Antibiotic medicines are prescribed if there is an infection. Other medicines may be given to reduce the size of the uterus (contract) if there is a lot of bleeding.   If you have Rh negative blood and your baby was Rh positive, you will need a Rh immunoglobulin shot. This shot will protect any future baby from having Rh blood problems in future pregnancies.  HOME CARE INSTRUCTIONS    Your caregiver may order bed rest or may allow you to continue light activity. Resume activity as directed by your caregiver.   Have someone help with home and family responsibilities during this time.    Keep track of the number of sanitary pads you use each day and how soaked (saturated) they are. Write down this information.    Do not use tampons. Do not douche or have sexual intercourse until approved by your caregiver.    Only take over-the-counter or prescription medicines for pain or discomfort as directed by your caregiver.    Do not take aspirin. Aspirin can cause bleeding.    Keep all follow-up appointments with your caregiver.    If you or your partner have problems with grieving, talk to your caregiver or seek counseling to help cope with the pregnancy loss. Allow enough time to grieve before trying to get pregnant again.     SEEK IMMEDIATE MEDICAL CARE IF:    You have severe cramps or pain in your back or abdomen.   You have a fever.   You pass large blood clots (walnut-sized or larger) ortissue from your vagina. Save any tissue for your caregiver to inspect.    Your bleeding increases.    You have a thick, bad-smelling vaginal discharge.   You become lightheaded, weak, or you faint.    You have chills.   MAKE SURE YOU:   Understand these instructions.   Will watch your condition.   Will get help right away if you are not doing well or get worse.     This  information is not intended to replace advice given to you by your health care provider. Make sure you discuss any questions you have with your health care provider.     Document Released: 09/26/2000 Document Revised: 07/28/2012 Document Reviewed: 05/22/2011  Elsevier Interactive Patient Education 2016 [employer]

## 2015-06-23 LAB — GC/CHLAMYDIA PROBE AMP (~~LOC~~) NOT AT ARMC
CHLAMYDIA, DNA PROBE: NEGATIVE
NEISSERIA GONORRHEA: NEGATIVE

## 2015-06-23 LAB — PROGESTERONE: PROGESTERONE: 1.2 ng/mL

## 2015-06-30 ENCOUNTER — Other Ambulatory Visit (HOSPITAL_COMMUNITY)
Admission: AD | Admit: 2015-06-30 | Discharge: 2015-06-30 | Disposition: A | Discharge status: Home / Self Care | Payer: Medicaid Other | Source: Ambulatory Visit | Attending: Obstetrics and Gynecology | Admitting: Obstetrics and Gynecology

## 2015-06-30 DIAGNOSIS — O209 Hemorrhage in early pregnancy, unspecified: Secondary | ICD-10-CM | POA: Insufficient documentation

## 2015-06-30 LAB — HCG, QUANTITATIVE, PREGNANCY: hCG, Beta Chain, Quant, S: 45 m[IU]/mL — ABNORMAL HIGH (ref <5)

## 2016-04-16 NOTE — L&D Delivery Note (Signed)
Delivery Note At 6:21 PM a viable female was delivered via Vaginal, Spontaneous (Presentation: ;  ).  APGAR: 9, 9; weight  .   Placenta status: , .  Cord:  with the following complications: .  Cord pH: na Anesthesia:   Episiotomy: None Lacerations: None Suture Repair: na Est. Blood Loss (mL): 100  Mom to postpartum.  Baby to Couplet care / Skin to Skin.  [place] A 03/27/2017, 6:48 PM

## 2016-04-26 ENCOUNTER — Encounter (HOSPITAL_COMMUNITY): Payer: Self-pay

## 2016-08-12 ENCOUNTER — Encounter (HOSPITAL_COMMUNITY): Payer: Self-pay | Admitting: *Deleted

## 2016-08-12 ENCOUNTER — Inpatient Hospital Stay (HOSPITAL_COMMUNITY): Payer: Self-pay

## 2016-08-12 ENCOUNTER — Inpatient Hospital Stay (HOSPITAL_COMMUNITY)
Admission: AD | Admit: 2016-08-12 | Discharge: 2016-08-12 | Disposition: A | Discharge status: Home / Self Care | Payer: Self-pay | Source: Ambulatory Visit | Attending: Obstetrics & Gynecology | Admitting: Obstetrics & Gynecology

## 2016-08-12 DIAGNOSIS — Z882 Allergy status to sulfonamides status: Secondary | ICD-10-CM | POA: Insufficient documentation

## 2016-08-12 DIAGNOSIS — Z803 Family history of malignant neoplasm of breast: Secondary | ICD-10-CM | POA: Insufficient documentation

## 2016-08-12 DIAGNOSIS — D270 Benign neoplasm of right ovary: Secondary | ICD-10-CM | POA: Insufficient documentation

## 2016-08-12 DIAGNOSIS — Z823 Family history of stroke: Secondary | ICD-10-CM | POA: Insufficient documentation

## 2016-08-12 DIAGNOSIS — Z3491 Encounter for supervision of normal pregnancy, unspecified, first trimester: Secondary | ICD-10-CM

## 2016-08-12 DIAGNOSIS — O468X1 Other antepartum hemorrhage, first trimester: Secondary | ICD-10-CM

## 2016-08-12 DIAGNOSIS — Z87891 Personal history of nicotine dependence: Secondary | ICD-10-CM | POA: Insufficient documentation

## 2016-08-12 DIAGNOSIS — O418X10 Other specified disorders of amniotic fluid and membranes, first trimester, not applicable or unspecified: Secondary | ICD-10-CM

## 2016-08-12 DIAGNOSIS — O23599 Infection of other part of genital tract in pregnancy, unspecified trimester: Secondary | ICD-10-CM | POA: Insufficient documentation

## 2016-08-12 DIAGNOSIS — Z833 Family history of diabetes mellitus: Secondary | ICD-10-CM | POA: Insufficient documentation

## 2016-08-12 DIAGNOSIS — O219 Vomiting of pregnancy, unspecified: Secondary | ICD-10-CM | POA: Insufficient documentation

## 2016-08-12 DIAGNOSIS — Z3A10 10 weeks gestation of pregnancy: Secondary | ICD-10-CM | POA: Insufficient documentation

## 2016-08-12 DIAGNOSIS — O26891 Other specified pregnancy related conditions, first trimester: Secondary | ICD-10-CM | POA: Insufficient documentation

## 2016-08-12 DIAGNOSIS — R1030 Lower abdominal pain, unspecified: Secondary | ICD-10-CM | POA: Insufficient documentation

## 2016-08-12 DIAGNOSIS — D279 Benign neoplasm of unspecified ovary: Secondary | ICD-10-CM

## 2016-08-12 DIAGNOSIS — O208 Other hemorrhage in early pregnancy: Secondary | ICD-10-CM | POA: Insufficient documentation

## 2016-08-12 DIAGNOSIS — R109 Unspecified abdominal pain: Secondary | ICD-10-CM

## 2016-08-12 DIAGNOSIS — Z3201 Encounter for pregnancy test, result positive: Secondary | ICD-10-CM | POA: Insufficient documentation

## 2016-08-12 DIAGNOSIS — O26899 Other specified pregnancy related conditions, unspecified trimester: Secondary | ICD-10-CM

## 2016-08-12 DIAGNOSIS — B9689 Other specified bacterial agents as the cause of diseases classified elsewhere: Secondary | ICD-10-CM | POA: Insufficient documentation

## 2016-08-12 DIAGNOSIS — O3481 Maternal care for other abnormalities of pelvic organs, first trimester: Secondary | ICD-10-CM

## 2016-08-12 DIAGNOSIS — O9989 Other specified diseases and conditions complicating pregnancy, childbirth and the puerperium: Secondary | ICD-10-CM

## 2016-08-12 DIAGNOSIS — Z825 Family history of asthma and other chronic lower respiratory diseases: Secondary | ICD-10-CM | POA: Insufficient documentation

## 2016-08-12 DIAGNOSIS — Z8249 Family history of ischemic heart disease and other diseases of the circulatory system: Secondary | ICD-10-CM | POA: Insufficient documentation

## 2016-08-12 DIAGNOSIS — N760 Acute vaginitis: Secondary | ICD-10-CM | POA: Insufficient documentation

## 2016-08-12 DIAGNOSIS — O3482 Maternal care for other abnormalities of pelvic organs, second trimester: Secondary | ICD-10-CM | POA: Insufficient documentation

## 2016-08-12 DIAGNOSIS — Z888 Allergy status to other drugs, medicaments and biological substances status: Secondary | ICD-10-CM | POA: Insufficient documentation

## 2016-08-12 LAB — URINALYSIS, ROUTINE W REFLEX MICROSCOPIC
Bilirubin Urine: NEGATIVE
Glucose, UA: NEGATIVE mg/dL
Hgb urine dipstick: NEGATIVE
Ketones, ur: NEGATIVE mg/dL
Nitrite: NEGATIVE
PH: 5 (ref 5.0–8.0)
Protein, ur: NEGATIVE mg/dL
SPECIFIC GRAVITY, URINE: 1.01 (ref 1.005–1.030)

## 2016-08-12 LAB — WET PREP, GENITAL
Sperm: NONE SEEN
Trich, Wet Prep: NONE SEEN
Yeast Wet Prep HPF POC: NONE SEEN

## 2016-08-12 LAB — CBC
HEMATOCRIT: 38.4 % (ref 36.0–46.0)
HEMOGLOBIN: 13.2 g/dL (ref 12.0–15.0)
MCH: 29.2 pg (ref 26.0–34.0)
MCHC: 34.4 g/dL (ref 30.0–36.0)
MCV: 85 fL (ref 78.0–100.0)
Platelets: 211 10*3/uL (ref 150–400)
RBC: 4.52 MIL/uL (ref 3.87–5.11)
RDW: 13.5 % (ref 11.5–15.5)
WBC: 5.2 10*3/uL (ref 4.0–10.5)

## 2016-08-12 LAB — HCG, QUANTITATIVE, PREGNANCY: HCG, BETA CHAIN, QUANT, S: 15351 m[IU]/mL — AB (ref <5)

## 2016-08-12 LAB — OB RESULTS CONSOLE GC/CHLAMYDIA: GC PROBE AMP, GENITAL: NEGATIVE

## 2016-08-12 LAB — ABO/RH: ABO/RH(D): B NEG

## 2016-08-12 LAB — POCT PREGNANCY, URINE: PREG TEST UR: POSITIVE — AB

## 2016-08-12 MED ORDER — METRONIDAZOLE 500 MG PO TABS: 500.0000 mg | ORAL_TABLET | Freq: Two times a day (BID) | ORAL | 0 refills | Status: DC

## 2016-08-12 MED ORDER — PROMETHAZINE HCL 25 MG PO TABS: 25.0000 mg | ORAL_TABLET | Freq: Four times a day (QID) | ORAL | 0 refills | Status: DC | PRN

## 2016-08-12 NOTE — MAU Provider Note (Signed)
History     CSN: [id number]  Arrival date and time: 08/12/16 1221  First Provider Initiated Contact with Patient 08/12/16 1302      Chief Complaint  Patient presents with  . Possible Pregnancy  . Abdominal Pain   HPI  Sharon Browning is a 26 y.o. G3P1011 at [redacted]w[redacted]d who presents with abdominal pain. Has positive pregnancy test last week. Hx of miscarriage last year and if concerned. Reports lower abdominal pain that started Wednesday. Lower abdominal cramping that was constant on Wednesday. Since then has had intermittent lower abdominal pains that are sharp. Rates pain 5/10. Has not treated. Nothing makes better or worse. Endorses nausea and has vomited once. Denies vaginal bleeding, vaginal discharge, dysuria, diarrhea, or constipation.   OB History    Gravida Para Term Preterm AB Living   3 1 1   1 1    SAB TAB Ectopic Multiple Live Births   1     0 1      Past Medical History:  Diagnosis Date  . Ovarian cyst     Past Surgical History:  Procedure Laterality Date  . NO PAST SURGERIES      Family History  Problem Relation Age of Onset  . Asthma Brother   . Diabetes Maternal Grandmother   . Hypertension Maternal Grandmother   . Stroke Maternal Grandmother   . Cancer Paternal Grandmother     breast    Social History  Substance Use Topics  . Smoking status: Former Smoker    Types: Cigarettes  . Smokeless tobacco: Never Used     Comment: mid 2014  . Alcohol use No    Allergies:  Allergies  Allergen Reactions  . Triaminic Cold-Cough [Phenyleph-Diphenhydramine-Dm] Anaphylaxis    Pt can tolerate Acetaminophen. States allergy to Triaminic in pink bottle  . Sulfur Hives    Prescriptions Prior to Admission  Medication Sig Dispense Refill Last Dose  . doxylamine, Sleep, (UNISOM) 25 MG tablet Take 25 mg by mouth at bedtime as needed for sleep.   Past Week at Unknown time    Review of Systems  Constitutional: Negative.   Gastrointestinal: Positive for abdominal  pain and nausea. Negative for constipation, diarrhea and vomiting.  Genitourinary: Negative.    Physical Exam   Blood pressure 115/69, pulse 84, temperature 97.8 F (36.6 C), resp. rate 16, height 5\' 5"  (1.651 m), weight 179 lb (81.2 kg), last menstrual period 05/31/2016, unknown if currently breastfeeding.  Physical Exam  Nursing note and vitals reviewed. Constitutional: She is oriented to person, place, and time. She appears well-developed and well-nourished. No distress.  HENT:  Head: Normocephalic and atraumatic.  Eyes: Conjunctivae are normal. Right eye exhibits no discharge. Left eye exhibits no discharge. No scleral icterus.  Neck: Normal range of motion.  Cardiovascular: Normal rate, regular rhythm and normal heart sounds.   No murmur heard. Respiratory: Effort normal and breath sounds normal. No respiratory distress. She has no wheezes.  GI: Soft. She exhibits no distension. There is no tenderness. There is no rebound and no guarding.  Neurological: She is alert and oriented to person, place, and time.  Skin: Skin is warm and dry. She is not diaphoretic.  Psychiatric: She has a normal mood and affect. Her behavior is normal. Judgment and thought content normal.    MAU Course  Procedures Results for orders placed or performed during the hospital encounter of 08/12/16 (from the past 24 hour(s))  Urinalysis, Routine w reflex microscopic     Status: Abnormal  Collection Time: 08/12/16 12:30 PM  Result Value Ref Range   Color, Urine YELLOW YELLOW   APPearance CLEAR CLEAR   Specific Gravity, Urine 1.010 1.005 - 1.030   pH 5.0 5.0 - 8.0   Glucose, UA NEGATIVE NEGATIVE mg/dL   Hgb urine dipstick NEGATIVE NEGATIVE   Bilirubin Urine NEGATIVE NEGATIVE   Ketones, ur NEGATIVE NEGATIVE mg/dL   Protein, ur NEGATIVE NEGATIVE mg/dL   Nitrite NEGATIVE NEGATIVE   Leukocytes, UA SMALL (A) NEGATIVE   RBC / HPF 0-5 0 - 5 RBC/hpf   WBC, UA 0-5 0 - 5 WBC/hpf   Bacteria, UA RARE (A) NONE  SEEN   Squamous Epithelial / LPF 6-30 (A) NONE SEEN   Mucous PRESENT   Pregnancy, urine POC     Status: Abnormal   Collection Time: 08/12/16 12:49 PM  Result Value Ref Range   Preg Test, Ur POSITIVE (A) NEGATIVE  CBC     Status: None   Collection Time: 08/12/16  1:08 PM  Result Value Ref Range   WBC 5.2 4.0 - 10.5 K/uL   RBC 4.52 3.87 - 5.11 MIL/uL   Hemoglobin 13.2 12.0 - 15.0 g/dL   HCT 38.4 36.0 - 46.0 %   MCV 85.0 78.0 - 100.0 fL   MCH 29.2 26.0 - 34.0 pg   MCHC 34.4 30.0 - 36.0 g/dL   RDW 13.5 11.5 - 15.5 %   Platelets 211 150 - 400 K/uL  ABO/Rh     Status: None   Collection Time: 08/12/16  1:08 PM  Result Value Ref Range   ABO/RH(D) B NEG   hCG, quantitative, pregnancy     Status: Abnormal   Collection Time: 08/12/16  1:08 PM  Result Value Ref Range   hCG, Beta Chain, Quant, S 15,351 (H) <5 mIU/mL  Wet prep, genital     Status: Abnormal   Collection Time: 08/12/16  1:25 PM  Result Value Ref Range   Yeast Wet Prep HPF POC NONE SEEN NONE SEEN   Trich, Wet Prep NONE SEEN NONE SEEN   Clue Cells Wet Prep HPF POC PRESENT (A) NONE SEEN   WBC, Wet Prep HPF POC FEW (A) NONE SEEN   Sperm NONE SEEN    US Ob Comp Less 14 Wks  Result Date: 08/12/2016 CLINICAL DATA:  Vaginal bleeding and cramping. EXAM: OBSTETRIC <14 WK [place] AND TRANSVAGINAL OB US TECHNIQUE: Both transabdominal and transvaginal ultrasound examinations were performed for complete evaluation of the gestation as well as the maternal uterus, adnexal regions, and pelvic cul-de-sac. Transvaginal technique was performed to assess early pregnancy. COMPARISON:  06/22/2015 FINDINGS: Intrauterine gestational sac: Present Yolk sac:  Present Embryo:  Present Cardiac Activity: Present Heart Rate: 120  bpm CRL:  0.7 1  mm   6 w   4 d                  [place] EDC: Subchorionic hemorrhage:  Small to moderate. Maternal uterus/adnexae: There is heterogeneous in echogenicity circumscribed right adnexal mass which measures 8.4 x 8.6 x 5.2 cm,  thought to represent a dermoid. The left ovary has normal appearance. IMPRESSION: Single live intrauterine pregnancy corresponding to 6 weeks and 4 days gestation. This does not match the gestational age calculated by last menstrual period. Clinical correlation is recommended. Small to moderate subchorionic hemorrhage. Enlargement of known right adnexal dermoid, which now measures 8.6 cm in greatest dimension. Electronically Signed   By: Fidela Salisbury M.D.   On: 08/12/2016 14:43  US Ob Transvaginal  Result Date: 08/12/2016 CLINICAL DATA:  Vaginal bleeding and cramping. EXAM: OBSTETRIC <14 WK [place] AND TRANSVAGINAL OB US TECHNIQUE: Both transabdominal and transvaginal ultrasound examinations were performed for complete evaluation of the gestation as well as the maternal uterus, adnexal regions, and pelvic cul-de-sac. Transvaginal technique was performed to assess early pregnancy. COMPARISON:  06/22/2015 FINDINGS: Intrauterine gestational sac: Present Yolk sac:  Present Embryo:  Present Cardiac Activity: Present Heart Rate: 120  bpm CRL:  0.7 1  mm   6 w   4 d                  [place] EDC: Subchorionic hemorrhage:  Small to moderate. Maternal uterus/adnexae: There is heterogeneous in echogenicity circumscribed right adnexal mass which measures 8.4 x 8.6 x 5.2 cm, thought to represent a dermoid. The left ovary has normal appearance. IMPRESSION: Single live intrauterine pregnancy corresponding to 6 weeks and 4 days gestation. This does not match the gestational age calculated by last menstrual period. Clinical correlation is recommended. Small to moderate subchorionic hemorrhage. Enlargement of known right adnexal dermoid, which now measures 8.6 cm in greatest dimension. Electronically Signed   By: Fidela Salisbury M.D.   On: 08/12/2016 14:43     MDM +UPT UA, wet prep, GC/chlamydia, CBC, ABO/Rh, quant hCG, HIV, and [place] today to rule out ectopic pregnancy B negative -- no bleeding Ultrasound shows SIUP @  [redacted]w[redacted]d with cardiac activity, small [place], & dermoid cyst  Assessment and Plan  A: 1. Normal IUP (intrauterine pregnancy) on prenatal ultrasound, first trimester   2. Abdominal pain affecting pregnancy   3. BV (bacterial vaginosis)   4. Subchorionic hematoma in first trimester, single or unspecified fetus   5. Ovarian dermoid cyst complicating pregnancy, antepartum, first trimester   6. Nausea and vomiting during pregnancy prior to [redacted] weeks gestation    P: Discharge home Rx flagyl & phenergan Discussed reasons to return to [facility]U Prenatal provider list given -- start prenatal care GC/CT pending   Jorje Guild 08/12/2016, 12:58 PM

## 2016-08-12 NOTE — Discharge Instructions (Signed)
[place] for [place] at [facility]       Phone: [phone]  [facility] [place] [facility] [place] Phone: [place] for [place] at [place]  Phone: [place] for [place] at [facility]  Phone: [place] for [place] at [facility]  Phone: [facility]       Phone: [phone]  [facility]    Phone: [phone]   [facility])    Phone: [facility]    Phone: [phone]  [facility]    Phone: [place] [facility]  Phone: [place]    Phone: [phone]  [facility]   Phone: [phone]  [facility]    Phone: [phone]      Ovarian Cyst  An ovarian cyst is a fluid-filled sac that forms on an ovary. The ovaries are small organs that produce eggs in women. Various types of cysts can form on the ovaries. Some may cause symptoms and require treatment. Most ovarian cysts go away on their own, are not cancerous (are benign), and do not cause problems. Common types of ovarian cysts include:  Functional (follicle) cysts.  Occur during the menstrual cycle, and usually go away with the next menstrual cycle if you do not get pregnant.  Usually cause no symptoms.  Endometriomas.  Are cysts that form from the tissue that lines the uterus (endometrium).  Are sometimes called chocolate cysts because they become filled with blood that turns brown.  Can cause pain in the lower abdomen during intercourse and during your period.  Cystadenoma cysts.  Develop from cells on the outside surface of the ovary.  Can get very large and cause lower abdomen pain and pain with intercourse.  Can cause  severe pain if they twist or break open (rupture).  Dermoid cysts.  Are sometimes found in both ovaries.  May contain different kinds of body tissue, such as skin, teeth, hair, or cartilage.  Usually do not cause symptoms unless they get very big.  Theca lutein cysts.  Occur when too much of a certain hormone (human chorionic gonadotropin) is produced and overstimulates the ovaries to produce an egg.  Are most common after having procedures used to assist with the conception of a baby (in vitro fertilization). What are the causes? Ovarian cysts may be caused by:  Ovarian hyperstimulation syndrome. This is a condition that can develop from taking fertility medicines. It causes multiple large ovarian cysts to form.  Polycystic ovarian syndrome (PCOS). This is a common hormonal disorder that can cause ovarian cysts, as well as problems with your period or fertility. What increases the risk? The following factors may make you more likely to develop ovarian cysts:  Being overweight or obese.  Taking fertility medicines.  Taking certain forms of hormonal birth control.  Smoking. What are the signs or symptoms? Many ovarian cysts do not cause symptoms. If symptoms are present, they may include:  Pelvic pain or pressure.  Pain in the lower abdomen.  Pain during sex.  Abdominal swelling.  Abnormal menstrual periods.  Increasing pain with menstrual periods. How is this diagnosed? These cysts are commonly found during a routine pelvic exam. You may have tests to find out more about the cyst, such as:  Ultrasound.  X-ray of the pelvis.  CT scan.  MRI.  Blood tests. How is this treated? Many ovarian cysts go away on their own without treatment. Your health care provider may want to check your cyst regularly for 2-3 months to see if it changes. If you are in menopause, it is especially important to have your cyst monitored closely because menopausal women have a higher  rate of ovarian cancer. When treatment is needed, it may include:  Medicines to help relieve pain.  A procedure to drain the cyst (aspiration).  Surgery to remove the whole cyst.  Hormone treatment or birth control pills. These methods are sometimes used to help dissolve a cyst. Follow these instructions at home:  Take over-the-counter and prescription medicines only as told by your health care provider.  Do not drive or use heavy machinery while taking prescription pain medicine.  Get regular pelvic exams and Pap tests as often as told by your health care provider.  Return to your normal activities as told by your health care provider. Ask your health care provider what activities are safe for you.  Do not use any products that contain nicotine or tobacco, such as cigarettes and e-cigarettes. If you need help quitting, ask your health care provider.  Keep all follow-up visits as told by your health care provider. This is important. Contact a health care provider if:  Your periods are late, irregular, or painful, or they stop.  You have pelvic pain that does not go away.  You have pressure on your bladder or trouble emptying your bladder completely.  You have pain during sex.  You have any of the following in your abdomen:  A feeling of fullness.  Pressure.  Discomfort.  Pain that does not go away.  Swelling.  You feel generally ill.  You become constipated.  You lose your appetite.  You develop severe acne.  You start to have more body hair and facial hair.  You are gaining weight or losing weight without changing your exercise and eating habits.  You think you may be pregnant. Get help right away if:  You have abdominal pain that is severe or gets worse.  You cannot eat or drink without vomiting.  You suddenly develop a fever.  Your menstrual period is much heavier than usual. This information is not intended to replace advice given to you by your  health care provider. Make sure you discuss any questions you have with your health care provider. Document Released: 04/02/2005 Document Revised: 10/21/2015 Document Reviewed: 09/04/2015 Elsevier Interactive Patient Education  2017 [employer]. Bacterial Vaginosis Bacterial vaginosis is a vaginal infection that occurs when the normal balance of bacteria in the vagina is disrupted. It results from an overgrowth of certain bacteria. This is the most common vaginal infection among women ages 64-44. Because bacterial vaginosis increases your risk for STIs (sexually transmitted infections), getting treated can help reduce your risk for chlamydia, gonorrhea, herpes, and HIV (human immunodeficiency virus). Treatment is also important for preventing complications in pregnant women, because this condition can cause an early (premature) delivery. What are the causes? This condition is caused by an increase in harmful bacteria that are normally present in small amounts in the vagina. However, the reason that the condition develops is not fully understood. What increases the risk? The following factors may make you more likely to develop this condition:  Having a new sexual partner or multiple sexual partners.  Having unprotected sex.  Douching.  Having an intrauterine device (IUD).  Smoking.  Drug and alcohol abuse.  Taking certain antibiotic medicines.  Being pregnant. You cannot get bacterial vaginosis from toilet seats, bedding, swimming pools, or contact with objects around you. What are the signs or symptoms? Symptoms of this condition include:  Grey or white vaginal discharge. The discharge can also be watery or foamy.  A fish-like odor with discharge, especially after sexual intercourse or during menstruation.  Itching in and around the vagina.  Burning or pain with urination. Some women with bacterial vaginosis have no signs or symptoms. How is this diagnosed? This condition is  diagnosed based on:  Your medical history.  A physical exam of the vagina.  Testing a sample of vaginal fluid under a microscope to look for a large amount of bad bacteria or abnormal cells. Your health care provider may use a cotton swab or a small wooden spatula to collect the sample. How is this treated? This condition is treated with antibiotics. These may be given as a pill, a vaginal cream, or a medicine that is put into the vagina (suppository). If the condition comes back after treatment, a second round of antibiotics may be needed. Follow these instructions at home: Medicines   Take over-the-counter and prescription medicines only as told by your health care provider.  Take or use your antibiotic as told by your health care provider. Do not stop taking or using the antibiotic even if you start to feel better. General instructions   If you have a female sexual partner, tell her that you have a vaginal infection. She should see her health care provider and be treated if she has symptoms. If you have a female sexual partner, he does not need treatment.  During treatment:  Avoid sexual activity until you finish treatment.  Do not douche.  Avoid alcohol as directed by your health care provider.  Avoid breastfeeding as directed by your health care provider.  Drink enough water and fluids to keep your urine clear or pale yellow.  Keep the area around your vagina and rectum clean.  Wash the area daily with warm water.  Wipe yourself from front to back after using the toilet.  Keep all follow-up visits as told by your health care provider. This is important. How is this prevented?  Do not douche.  Wash the outside of your vagina with warm water only.  Use protection when having sex. This includes latex condoms and dental dams.  Limit how many sexual partners you have. To help prevent bacterial vaginosis, it is best to have sex with just one partner (monogamous).  Make  sure you and your sexual partner are tested for STIs.  Wear cotton or cotton-lined underwear.  Avoid wearing tight pants and pantyhose, especially during summer.  Limit the amount of alcohol that you drink.  Do not use any products that contain nicotine or tobacco, such as cigarettes and e-cigarettes. If you need help quitting, ask your health care provider.  Do not use illegal drugs. Where to find more information:  Centers for Disease Control and Prevention: AppraiserFraud.fi  American Sexual Health Association (ASHA): www.ashastd.org  U.S. Department of Health and Financial controller, Office on Women's Health: DustingSprays.pl or SecuritiesCard.it Contact a health care provider if:  Your symptoms do not improve, even after treatment.  You have more discharge or pain when urinating.  You have a fever.  You have pain in your abdomen.  You have pain during sex.  You have vaginal bleeding between periods. Summary  Bacterial vaginosis is a vaginal infection that occurs when  the normal balance of bacteria in the vagina is disrupted.  Because bacterial vaginosis increases your risk for STIs (sexually transmitted infections), getting treated can help reduce your risk for chlamydia, gonorrhea, herpes, and HIV (human immunodeficiency virus). Treatment is also important for preventing complications in pregnant women, because the condition can cause an early (premature) delivery.  This condition is treated with antibiotic medicines. These may be given as a pill, a vaginal cream, or a medicine that is put into the vagina (suppository). This information is not intended to replace advice given to you by your health care provider. Make sure you discuss any questions you have with your health care provider. Document Released: 04/02/2005 Document Revised: 12/17/2015 Document Reviewed: 12/17/2015 Elsevier Interactive Patient Education  2017 [place] Hematoma A subchorionic hematoma is a gathering of blood between the outer wall of the placenta and the inner wall of the womb (uterus). The placenta is the organ that connects the fetus to the wall of the uterus. The placenta performs the feeding, breathing (oxygen to the fetus), and waste removal (excretory work) of the fetus. Subchorionic hematoma is the most common abnormality found on a result from ultrasonography done during the first trimester or early second trimester of pregnancy. If there has been little or no vaginal bleeding, early small hematomas usually shrink on their own and do not affect your baby or pregnancy. The blood is gradually absorbed over 1-2 weeks. When bleeding starts later in pregnancy or the hematoma is larger or occurs in an older pregnant woman, the outcome may not be as good. Larger hematomas may get bigger, which increases the chances for miscarriage. Subchorionic hematoma also increases the risk of premature detachment of the placenta from the uterus, preterm (premature) labor, and stillbirth. Follow these instructions at home:  Stay on bed rest if your health care provider recommends this. Although bed rest will not prevent more bleeding or prevent a miscarriage, your health care provider may recommend bed rest until you are advised otherwise.  Avoid heavy lifting (more than 10 lb [4.5 kg]), exercise, sexual intercourse, or douching as directed by your health care provider.  Keep track of the number of pads you use each day and how soaked (saturated) they are. Write down this information.  Do not use tampons.  Keep all follow-up appointments as directed by your health care provider. Your health care provider may ask you to have follow-up blood tests or ultrasound tests or both. Get help right away if:  You have severe cramps in your stomach, back, abdomen, or pelvis.  You have a fever.  You pass large clots or tissue. Save any tissue for your  health care provider to look at.  Your bleeding increases or you become lightheaded, feel weak, or have fainting episodes. This information is not intended to replace advice given to you by your health care provider. Make sure you discuss any questions you have with your health care provider. Document Released: 07/18/2006 Document Revised: 09/08/2015 Document Reviewed: 10/30/2012 Elsevier Interactive Patient Education  2017 [employer].

## 2016-08-12 NOTE — MAU Note (Signed)
Pt presents to MAU with complaints of lower abdominal cramping and states she had a miscarriage last year and is concerned. Denies any vaginal bleeding

## 2016-08-13 LAB — HIV ANTIBODY (ROUTINE TESTING W REFLEX): HIV Screen 4th Generation wRfx: NONREACTIVE

## 2016-08-13 LAB — GC/CHLAMYDIA PROBE AMP (~~LOC~~) NOT AT ARMC
Chlamydia: NEGATIVE
Neisseria Gonorrhea: NEGATIVE

## 2017-01-08 LAB — OB RESULTS CONSOLE GBS: STREP GROUP B AG: POSITIVE

## 2017-02-11 IMAGING — US US OB COMP LESS 14 WK
1 series · 13 of 28 positions shown · non-contrast
Comparison: None.

CLINICAL DATA: Bleeding during early pregnancy. Estimated
gestational age by LMP is 8 weeks 0 days. Quantitative beta HCG has
not been done.

EXAM:
OBSTETRIC <14 WK US AND TRANSVAGINAL OB US
TECHNIQUE: Both transabdominal and transvaginal ultrasound examinations were
performed for complete evaluation of the gestation as well as the
maternal uterus, adnexal regions, and pelvic cul-de-sac.
Transvaginal technique was performed to assess early pregnancy.

[Series 1: us ob comp less 14 wk · 0.22mm/px · 13 of 40 slices shown]
[im 2/40]
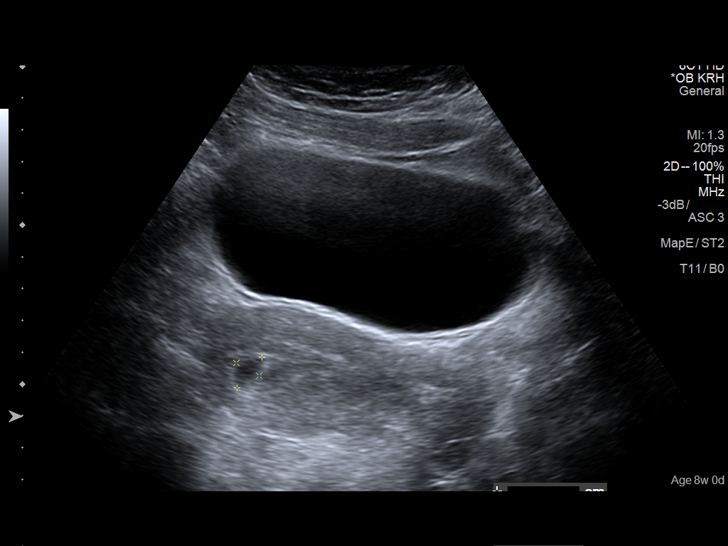
[im 5/40]
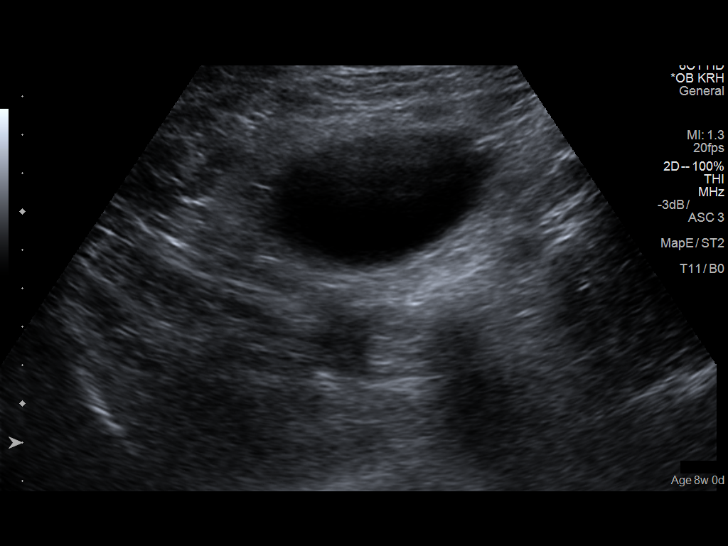
[im 8/40]
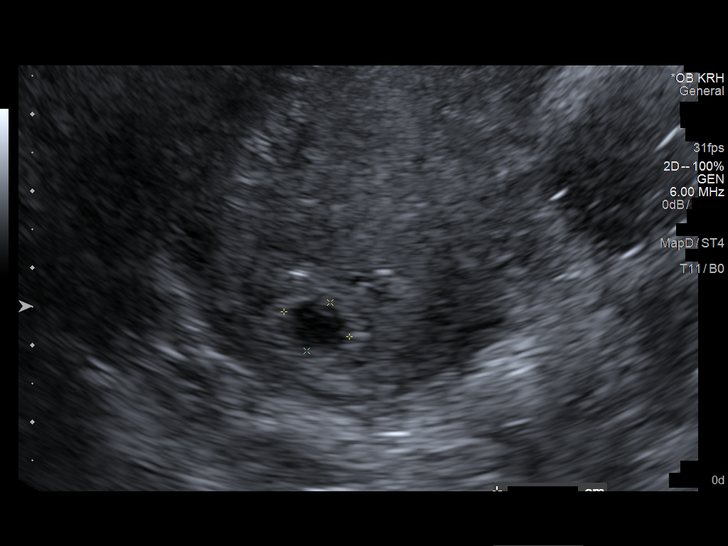
[im 11/40]
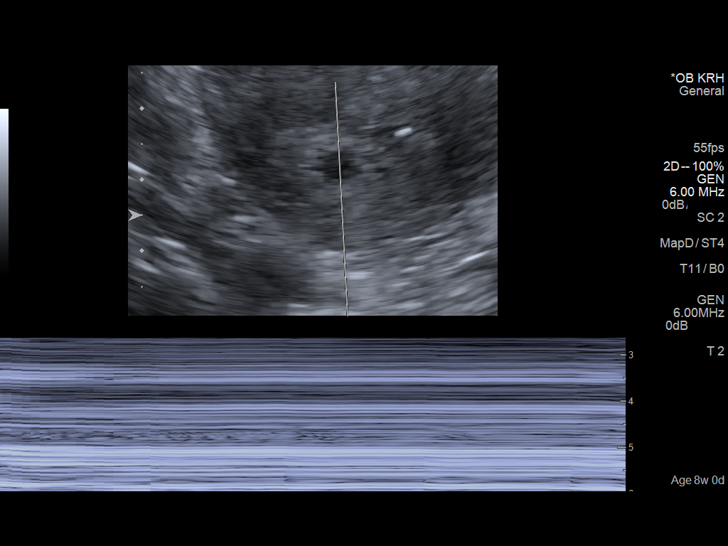
[im 14/40]
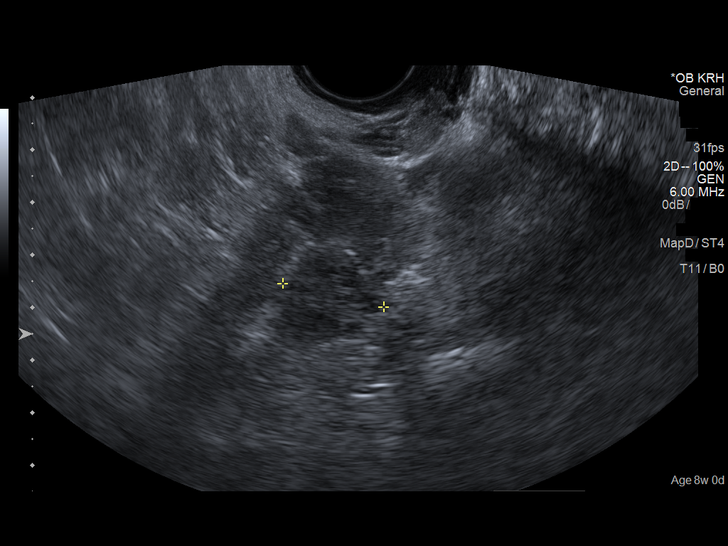
[im 16/40]
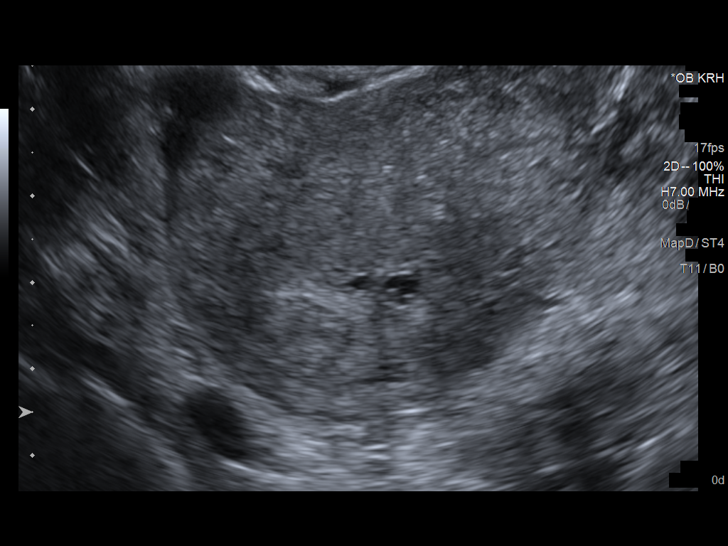
[im 21/40]
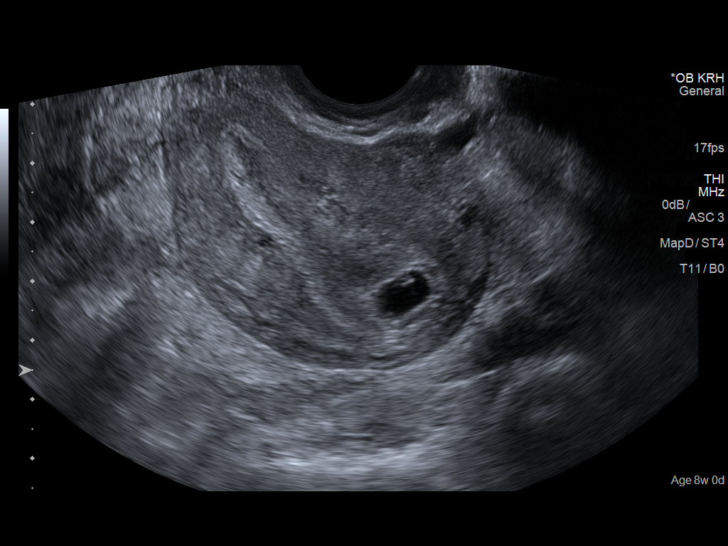
[im 24/40]
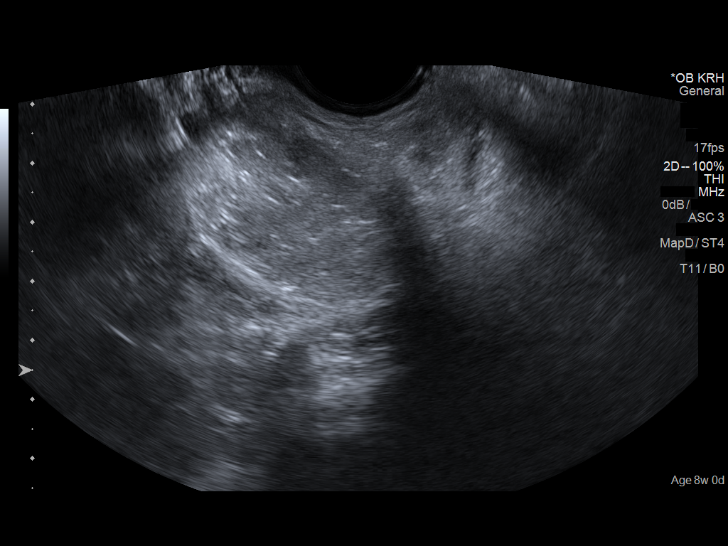
[im 27/40]
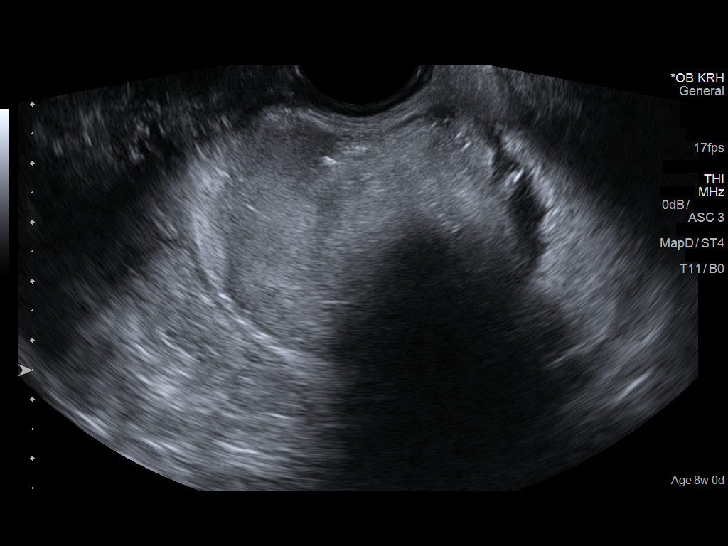
[im 29/40]
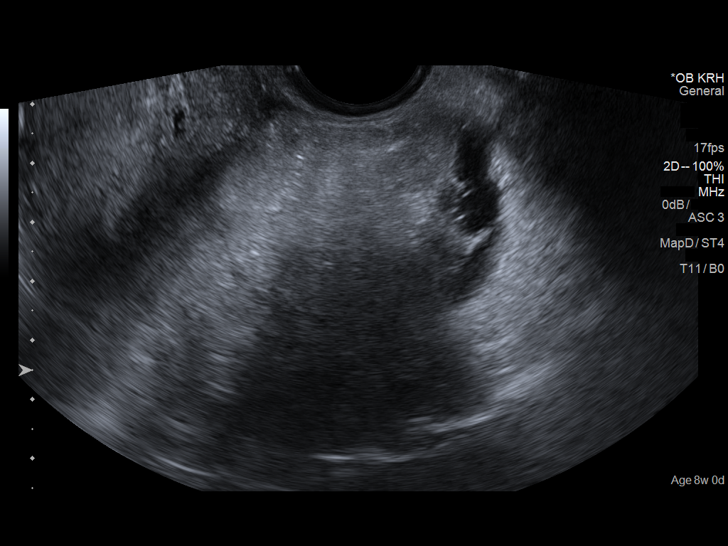
[im 32/40]
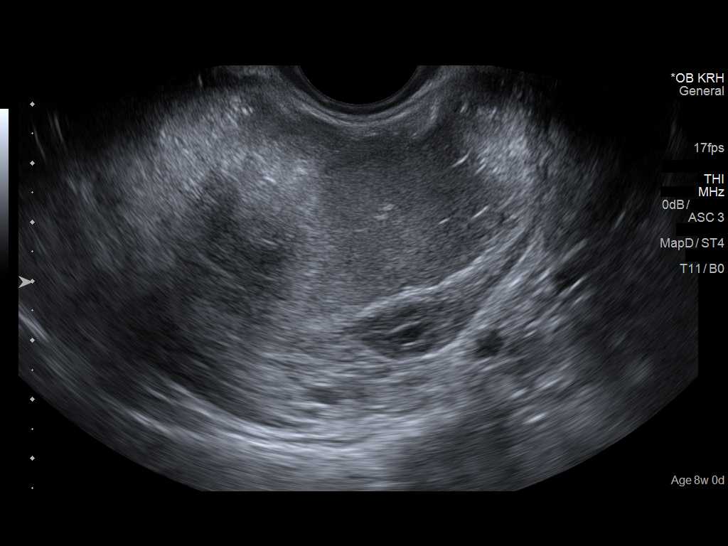
[im 35/40]
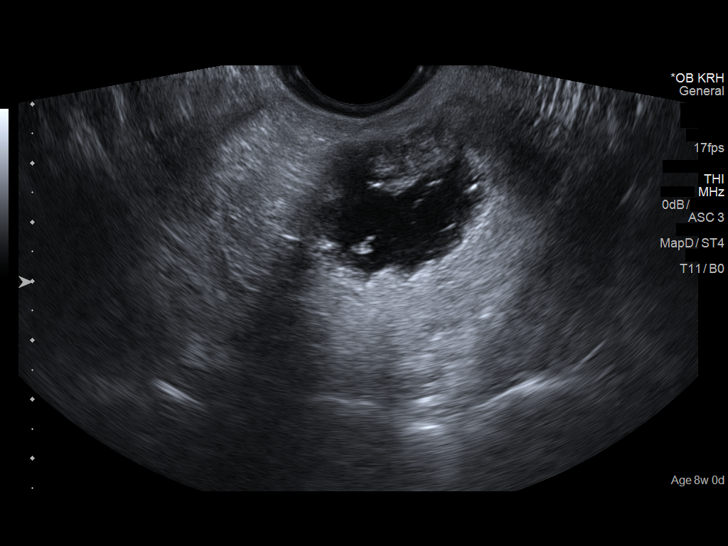
[im 38/40]
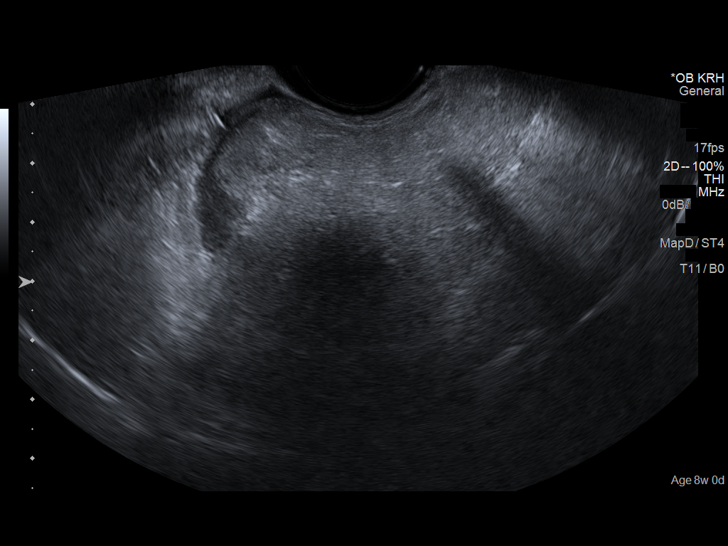

[13 of 28 positions shown; findings below may reference images not displayed]

FINDINGS: Intrauterine gestational sac: A single ovoid intrauterine
gestational sac is demonstrated.

Yolk sac:  Yolk sac was not visualized.

Embryo:  Fetal pole is identified.

Cardiac Activity: Fetal cardiac activity is visualized.

Heart Rate:   Fetal heart rate could not be measured.

CRL:  3.1  mm   5 w   6 d                  US EDC: 02/14/2016

Maternal uterus/adnexae: Small nabothian cysts in the uterus. No
myometrial masses. Small cyst adjacent to the gestational sac of
nonspecific etiology. Possible small subchorionic hemorrhages.

Right ovary is visualized and appears normal with normal follicular
changes present.

In the left adnexa, there is a complex mostly hyperechoic mass
measuring about 5.9 x 3.8 x 6.2 cm. Cystic and solid structures are
present. Appearance suggests a dermoid cyst.

Small amount of free fluid in the pelvis.
IMPRESSION: A single intrauterine gestational sac with fetal pole is identified.
Fetal cardiac activity is visualized but could not be measured,
likely due to early gestational age. Estimated gestational age by
crown-rump length is 5 weeks 6 days.

Large complex mass in the left adnexum measuring up to 6.2 cm
diameter suggesting a dermoid cyst.

## 2017-03-07 IMAGING — US US OB TRANSVAGINAL
1 series · 15 of 28 positions shown · non-contrast
Comparison: None.

CLINICAL DATA: First trimester pregnancy, bleeding and cramping;
hcg on 06/21/15 was 0324

EXAM:
OBSTETRIC <14 WK US AND TRANSVAGINAL OB US
TECHNIQUE: Both transabdominal and transvaginal ultrasound examinations were
performed for complete evaluation of the gestation as well as the
maternal uterus, adnexal regions, and pelvic cul-de-sac.
Transvaginal technique was performed to assess early pregnancy.

[Series 1: us ob transvaginal · 45 acquisitions, 15 frames shown]
[im 1/45]
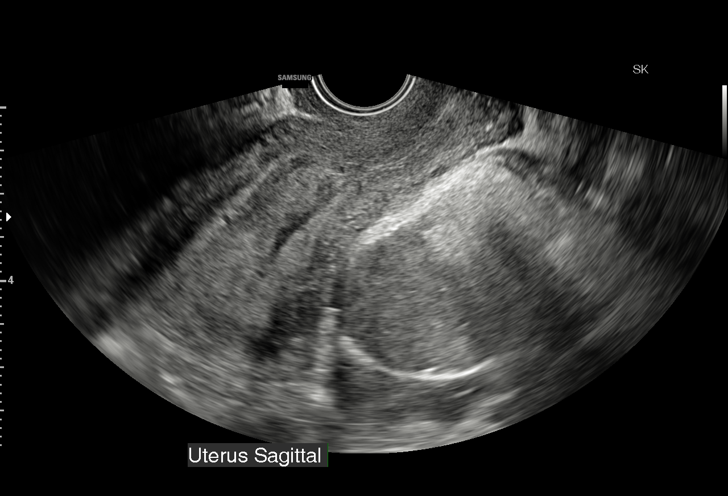
[im 4/45]
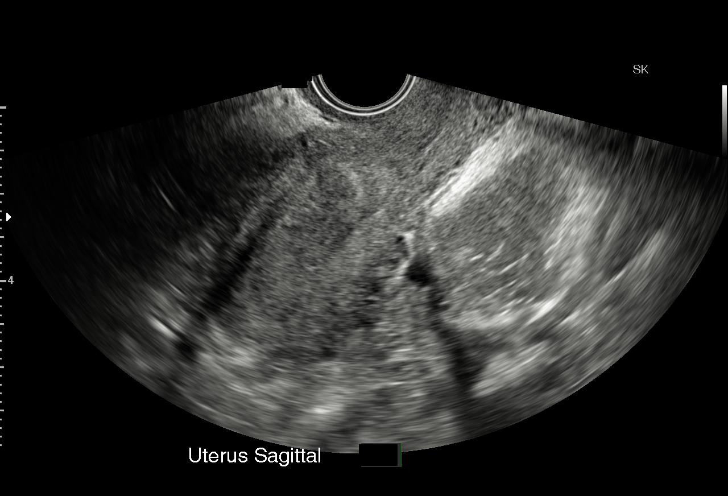
[im 7/45]
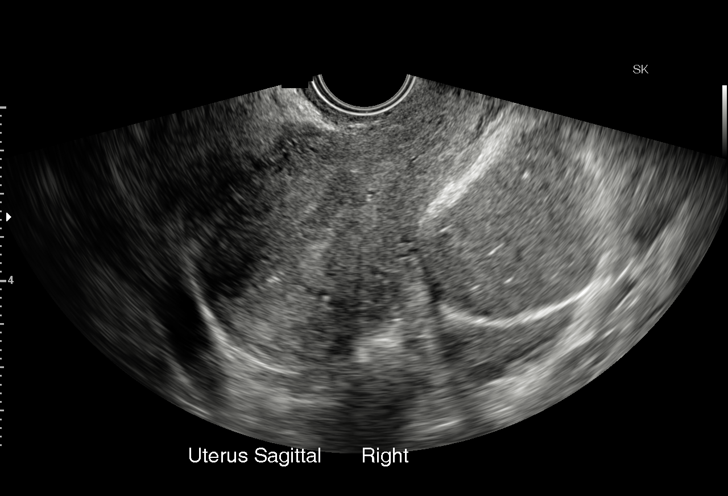
[im 10/45]
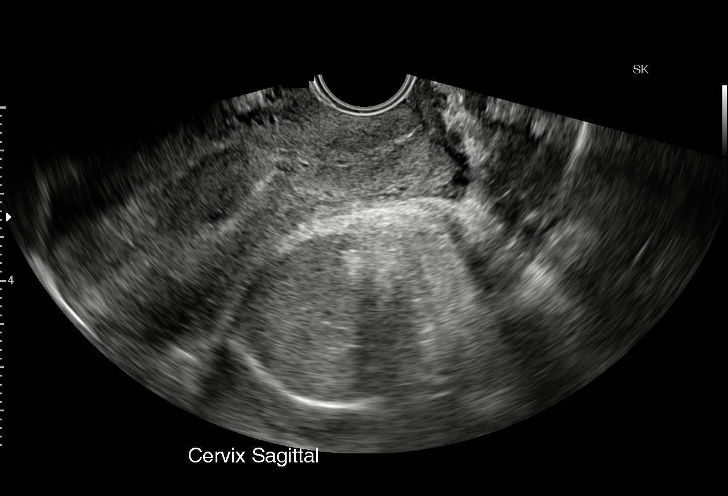
[im 14/45]
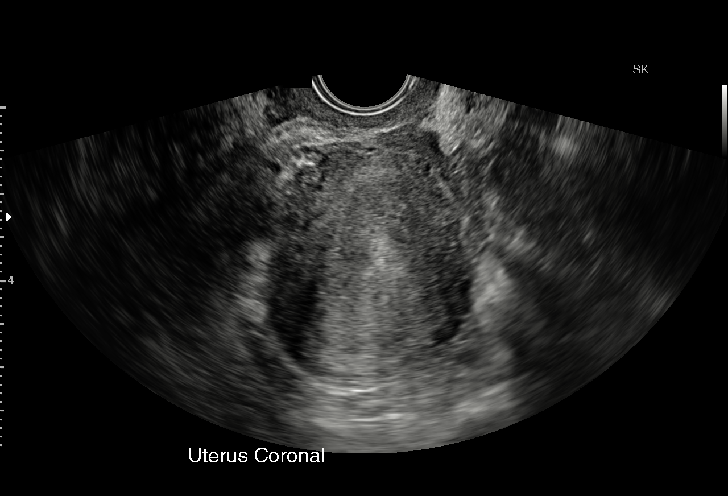
[im 17/45]
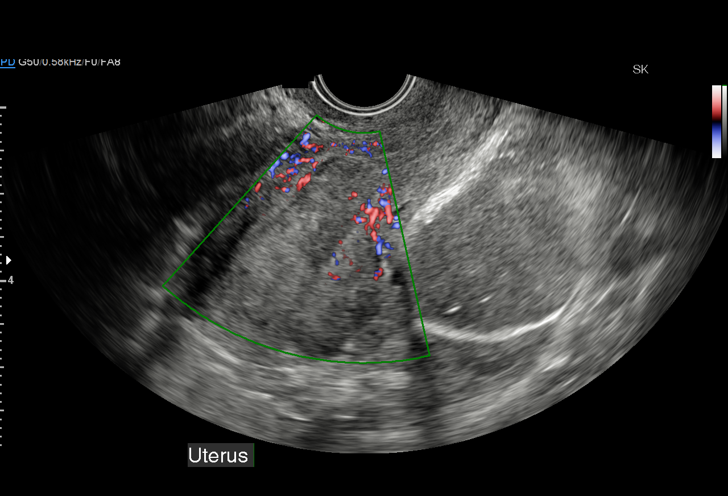
[im 20/45]
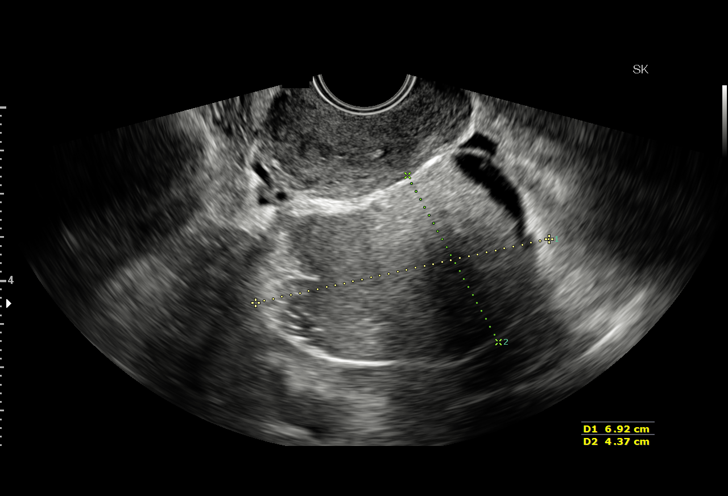
[im 23/45]
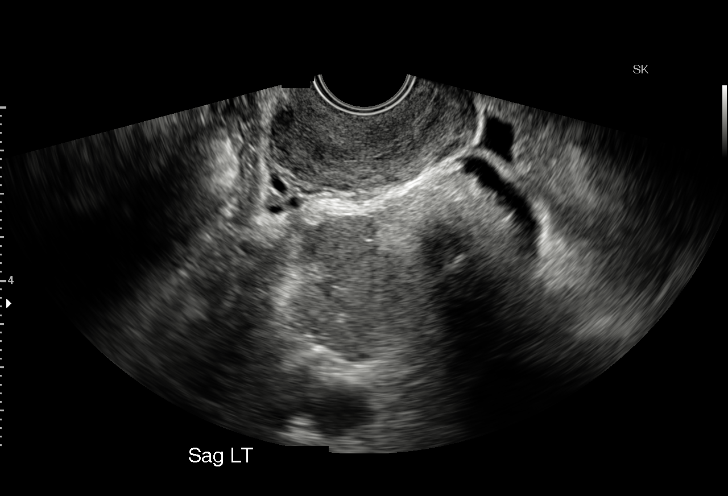
[im 25/45]
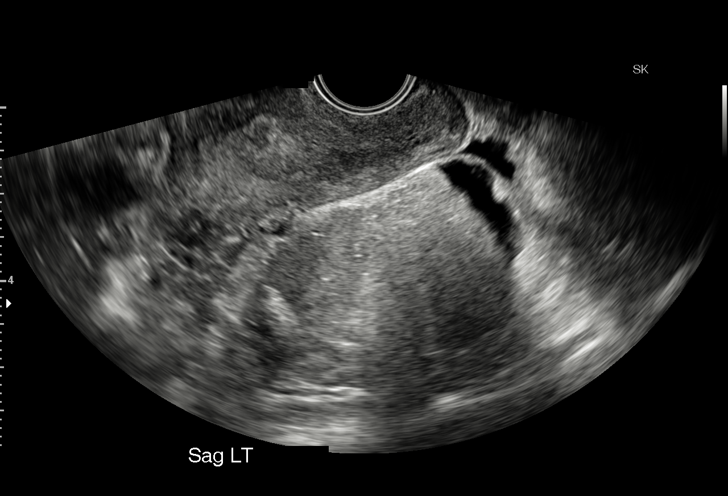
[im 28/45]
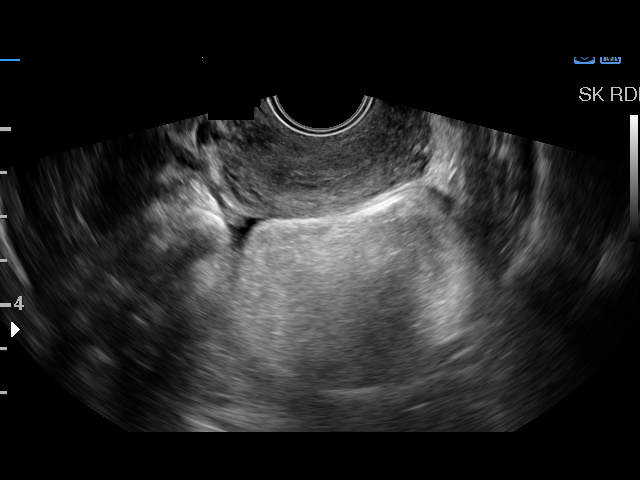
[im 31/45]
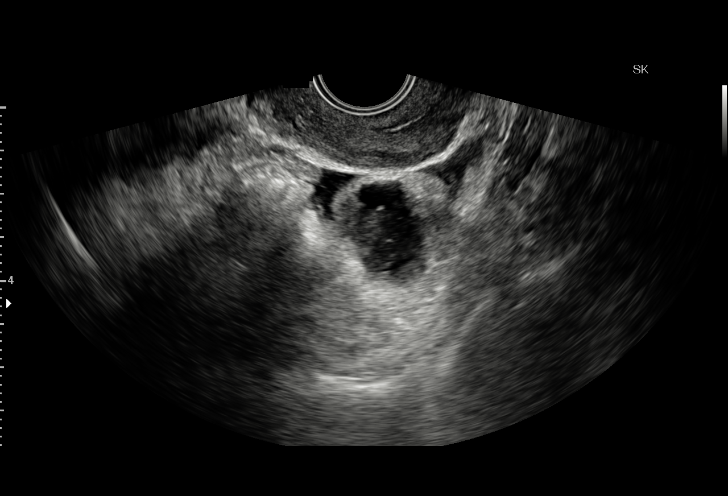
[im 35/45]
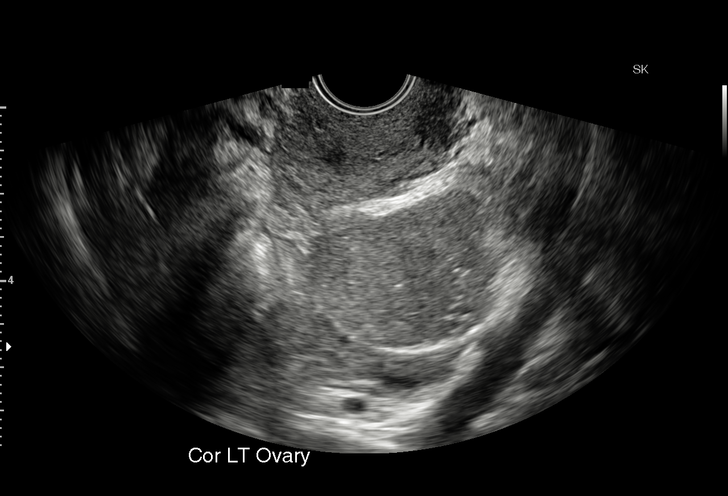
[im 38/45]
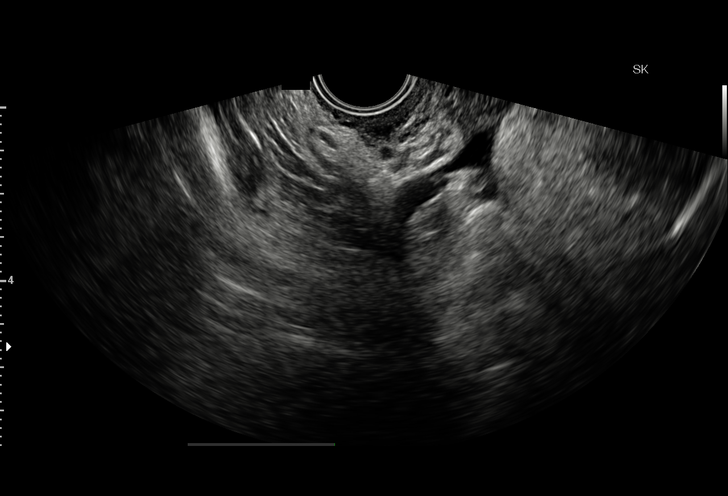
[im 41/45]
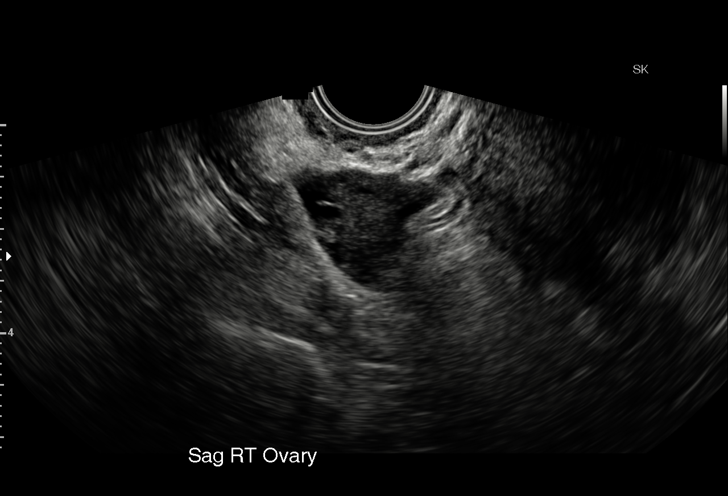
[im 45/45]
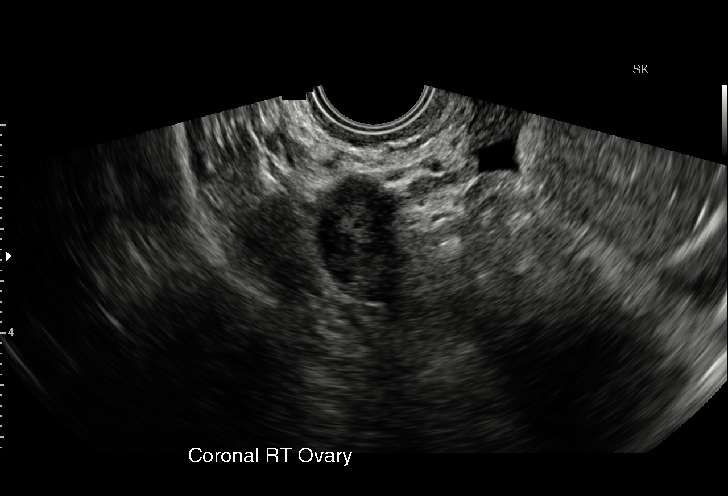

[15 of 28 positions shown; findings below may reference images not displayed]

FINDINGS: Although an intrauterine gestational sac was seen on the prior
study, none is seen today.

There is again a 7 cm left adnexal complex mass with mixed solid and
cystic components, compatible with a dermoid. The right ovary is
normal. There is trace free fluid in the pelvis.

The endometrial canal is distended to 17mm, with echogenic material
consistent with blood.
IMPRESSION: Findings suggest failed gestation, as the gestational sac has
vanished and blood distends the endometrial canal.

Left ovarian mass suggesting dermoid again identified. On a
nonemergent basis, this mass could be characterized more
definitively with pelvic MRI.

## 2017-03-18 ENCOUNTER — Inpatient Hospital Stay (HOSPITAL_COMMUNITY)
Admission: AD | Admit: 2017-03-18 | Discharge: 2017-03-18 | Disposition: A | Discharge status: Home / Self Care | Payer: Medicaid Other | Source: Ambulatory Visit | Attending: Obstetrics & Gynecology | Admitting: Obstetrics & Gynecology

## 2017-03-18 ENCOUNTER — Encounter (HOSPITAL_COMMUNITY): Payer: Self-pay

## 2017-03-18 DIAGNOSIS — Z3A37 37 weeks gestation of pregnancy: Secondary | ICD-10-CM | POA: Diagnosis not present

## 2017-03-18 DIAGNOSIS — Z87891 Personal history of nicotine dependence: Secondary | ICD-10-CM | POA: Insufficient documentation

## 2017-03-18 DIAGNOSIS — O26893 Other specified pregnancy related conditions, third trimester: Secondary | ICD-10-CM | POA: Diagnosis not present

## 2017-03-18 DIAGNOSIS — O368130 Decreased fetal movements, third trimester, not applicable or unspecified: Secondary | ICD-10-CM

## 2017-03-18 DIAGNOSIS — Z825 Family history of asthma and other chronic lower respiratory diseases: Secondary | ICD-10-CM | POA: Insufficient documentation

## 2017-03-18 DIAGNOSIS — Z882 Allergy status to sulfonamides status: Secondary | ICD-10-CM | POA: Diagnosis not present

## 2017-03-18 DIAGNOSIS — Z888 Allergy status to other drugs, medicaments and biological substances status: Secondary | ICD-10-CM | POA: Insufficient documentation

## 2017-03-18 DIAGNOSIS — R42 Dizziness and giddiness: Secondary | ICD-10-CM | POA: Diagnosis present

## 2017-03-18 LAB — COMPREHENSIVE METABOLIC PANEL
ALK PHOS: 246 U/L — AB (ref 38–126)
ALT: 26 U/L (ref 14–54)
AST: 22 U/L (ref 15–41)
Albumin: 3 g/dL — ABNORMAL LOW (ref 3.5–5.0)
Anion gap: 10 (ref 5–15)
BILIRUBIN TOTAL: 0.6 mg/dL (ref 0.3–1.2)
BUN: 7 mg/dL (ref 6–20)
CALCIUM: 9.1 mg/dL (ref 8.9–10.3)
CO2: 21 mmol/L — ABNORMAL LOW (ref 22–32)
Chloride: 101 mmol/L (ref 101–111)
Creatinine, Ser: 0.66 mg/dL (ref 0.44–1.00)
Glucose, Bld: 80 mg/dL (ref 65–99)
Potassium: 4.2 mmol/L (ref 3.5–5.1)
Sodium: 132 mmol/L — ABNORMAL LOW (ref 135–145)
TOTAL PROTEIN: 7.8 g/dL (ref 6.5–8.1)

## 2017-03-18 LAB — URINALYSIS, ROUTINE W REFLEX MICROSCOPIC
Bilirubin Urine: NEGATIVE
GLUCOSE, UA: NEGATIVE mg/dL
Hgb urine dipstick: NEGATIVE
KETONES UR: NEGATIVE mg/dL
Nitrite: NEGATIVE
PH: 5 (ref 5.0–8.0)
Protein, ur: NEGATIVE mg/dL
SPECIFIC GRAVITY, URINE: 1.016 (ref 1.005–1.030)

## 2017-03-18 LAB — CBC WITH DIFFERENTIAL/PLATELET
BASOS PCT: 0 %
Basophils Absolute: 0 10*3/uL (ref 0.0–0.1)
Eosinophils Absolute: 0 10*3/uL (ref 0.0–0.7)
Eosinophils Relative: 0 %
HCT: 35.2 % — ABNORMAL LOW (ref 36.0–46.0)
HEMOGLOBIN: 11.5 g/dL — AB (ref 12.0–15.0)
LYMPHS ABS: 2.2 10*3/uL (ref 0.7–4.0)
Lymphocytes Relative: 26 %
MCH: 28.4 pg (ref 26.0–34.0)
MCHC: 32.7 g/dL (ref 30.0–36.0)
MCV: 86.9 fL (ref 78.0–100.0)
MONO ABS: 0.4 10*3/uL (ref 0.1–1.0)
MONOS PCT: 5 %
NEUTROS ABS: 5.6 10*3/uL (ref 1.7–7.7)
Neutrophils Relative %: 69 %
Platelets: 291 10*3/uL (ref 150–400)
RBC: 4.05 MIL/uL (ref 3.87–5.11)
RDW: 13 % (ref 11.5–15.5)
WBC: 8.2 10*3/uL (ref 4.0–10.5)

## 2017-03-18 MED ORDER — MECLIZINE HCL 25 MG PO TABS: 25.0000 mg | ORAL_TABLET | Freq: Three times a day (TID) | ORAL | 0 refills | Status: DC | PRN

## 2017-03-18 MED ORDER — LACTATED RINGERS IV BOLUS (SEPSIS)
1000.0000 mL | Freq: Once | INTRAVENOUS | Status: AC
  Administered 2017-03-18: 1000 mL via INTRAVENOUS

## 2017-03-18 NOTE — MAU Note (Addendum)
History     26 Y/O G3P1011 @ 22 W 3 D EGA [facility] 04/05/17 here with CC of not feeling well for past several days.  She has been having dizziness, feels like room is spinning around and about to pass out.  Yesterday she had to stay in bed all day because she felt these symptoms and also felt tired.  This morning she had a vomiting episode.   She has not been eating much lately.  Her boyfriend recently has been suffering from a sore throat and patient has had contact with him recently.   She also felt her baby has been moving less but once in the MAU she felt normal fetal movement.   Patient Active Problem List   Diagnosis Date Noted  . Rh negative state in antepartum period 06/22/2015  . 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 Obesity 04/10/2014  . 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 HPV test positive 04/09/2014    Chief Complaint  Patient presents with  . Dizziness   HPI  OB History    Gravida Para Term Preterm AB Living   3 1 1   1 1    SAB TAB Ectopic Multiple Live Births   1     0 1      Past Medical History:  Diagnosis Date  . Ovarian cyst     Past Surgical History:  Procedure Laterality Date  . NO PAST SURGERIES      Family History  Problem Relation Age of Onset  . Asthma Brother   . Diabetes Maternal Grandmother   . Hypertension Maternal Grandmother   . Stroke Maternal Grandmother   . Cancer Paternal Grandmother        breast    Social History   Tobacco Use  . Smoking status: Former Smoker    Types: Cigarettes  . Smokeless tobacco: Never Used  . Tobacco comment: mid 2014  Substance Use Topics  . Alcohol use: No  . Drug use: No    Allergies:  Allergies  Allergen Reactions  . Triaminic Cold-Cough [Phenyleph-Diphenhydramine-Dm] Anaphylaxis    Pt can tolerate Acetaminophen. States allergy to Triaminic in pink bottle  . Sulfur Hives    Medications Prior to Admission  Medication Sig Dispense Refill  Last Dose  . doxylamine, Sleep, (UNISOM) 25 MG tablet Take 25 mg by mouth at bedtime as needed for sleep.   Past Week at Unknown time  . metroNIDAZOLE (FLAGYL) 500 MG tablet Take 1 tablet (500 mg total) by mouth 2 (two) times daily. 14 tablet 0   . promethazine (PHENERGAN) 25 MG tablet Take 1 tablet (25 mg total) by mouth every 6 (six) hours as needed for nausea or vomiting. 30 tablet 0     ROS  Constitutional: Denies fevers/chills With dizziness, fatigue.  HEENT: Denies sore throat, runny nose, headache Cardiovascular: Denies chest pain or palpitations Pulmonary: Denies coughing or wheezing Gastrointestinal: Denies nausea or diarrhea.  Positive for vomiting, constipation Genitourinary: Denies pelvic pain, unusual vaginal bleeding, unusual vaginal discharge, dysuria, urgency or frequency.  Musculoskeletal: Denies muscle or joint aches and pain.  Neurology: Denies abnormal sensations such as tingling or numbness.   Physical Exam  Her mother and grandmother are present in the room with her.   Blood pressure 120/77, pulse (!) 112, temperature 98.4 F (36.9 C), temperature source Oral, resp. rate 16, height 5\' 5"  (1.651 m), weight 100.2 kg (221 lb), last menstrual period 05/31/2016, SpO2 100 %,  unknown if currently breastfeeding. Constitutional: She is oriented to person, place, and time. She appears well-developed and well-nourished.  HENT:  Head: Normocephalic and atraumatic.  Neck: Normal range of motion.  Cardiovascular: Normal rate, regular rhythm and normal heart sounds.   Respiratory: Effort normal and breath sounds normal.  GI: Soft. Bowel sounds are normal.gravid.   Neurological: She is alert and oriented to person, place, and time.  Skin: Skin is warm and dry.  Psychiatric: She has a normal mood and affect. Her behavior is normal.   EFM: NST: 130 BL, mod var, reactive TOCO: No contractions.  CVX: Deferred.   Orthostatic BP checks:  Lying down: BP 84/65 pulse 92 Sitting  up: BP 109/65 pulse 99 Standing up: BP 72/53 pulse 111   ED Course   Patient noted to have orthostatic BPs,   -IV hydration was given. -Labs were drawn.  CBC    Component Value Date/Time   WBC 8.2 03/18/2017 1455   RBC 4.05 03/18/2017 1455   HGB 11.5 (L) 03/18/2017 1455   HCT 35.2 (L) 03/18/2017 1455   PLT 291 03/18/2017 1455   MCV 86.9 03/18/2017 1455   MCH 28.4 03/18/2017 1455   MCHC 32.7 03/18/2017 1455   RDW 13.0 03/18/2017 1455   LYMPHSABS 2.2 03/18/2017 1455   MONOABS 0.4 03/18/2017 1455   EOSABS 0.0 03/18/2017 1455   BASOSABS 0.0 03/18/2017 1455   CMP     Component Value Date/Time   NA 132 (L) 03/18/2017 1455   K 4.2 03/18/2017 1455   CL 101 03/18/2017 1455   CO2 21 (L) 03/18/2017 1455   GLUCOSE 80 03/18/2017 1455   BUN PENDING 03/18/2017 1455   CREATININE 0.66 03/18/2017 1455   CALCIUM 9.1 03/18/2017 1455   PROT 7.8 03/18/2017 1455   ALBUMIN 3.0 (L) 03/18/2017 1455   AST 22 03/18/2017 1455   ALT 26 03/18/2017 1455   ALKPHOS 246 (H) 03/18/2017 1455   BILITOT 0.6 03/18/2017 1455   GFRNONAA >60 03/18/2017 1455   GFRAA >60 03/18/2017 1455   Urinalysis    Component Value Date/Time   COLORURINE YELLOW 03/18/2017 1330   APPEARANCEUR CLEAR 03/18/2017 1330   LABSPEC 1.016 03/18/2017 1330   PHURINE 5.0 03/18/2017 1330   GLUCOSEU NEGATIVE 03/18/2017 1330   HGBUR NEGATIVE 03/18/2017 1330   BILIRUBINUR NEGATIVE 03/18/2017 1330   KETONESUR NEGATIVE 03/18/2017 1330   PROTEINUR NEGATIVE 03/18/2017 1330   NITRITE NEGATIVE 03/18/2017 1330   LEUKOCYTESUR TRACE (A) 03/18/2017 1330      Assessment: 26 y/o at 37W 3 D EGA with dizziness, possibly due to viral syndrome, versus secondary to not eating and hydrating well in the third trimester, patient reported feeling better after the IV hydration, she also reported resumed normal fetal movement,  Plan:  -Discharge patient to home with Office follow up 03/20/2017.  -Advised her to increase hydration, rest and  oral intake -Advised her to avoid sick contacts -May use meclizine if with persistent dizziness. -Fetal kick counts reviewed with patient.   Alinda Dooms MD.  03/18/2017 2:21 PM

## 2017-03-18 NOTE — Patient Instructions (Signed)
  Place dizziness patient instructions here.

## 2017-03-18 NOTE — MAU Note (Signed)
Pt reports some decreased fetal movement since yesterday. Has felt occasional movement but not as much as normal. Only has felt 1 movement today.

## 2017-03-18 NOTE — MAU Note (Signed)
Pt reports since last pm she has had episode where she feels really hot, clammy and like she is going to pass out. These symptoms come and go. States this am she vomited afterward.

## 2017-03-18 NOTE — Discharge Instructions (Signed)

## 2017-03-27 ENCOUNTER — Inpatient Hospital Stay (HOSPITAL_COMMUNITY): Payer: Medicaid Other | Admitting: Anesthesiology

## 2017-03-27 ENCOUNTER — Encounter (HOSPITAL_COMMUNITY): Payer: Self-pay

## 2017-03-27 ENCOUNTER — Inpatient Hospital Stay (HOSPITAL_COMMUNITY)
Admission: AD | Admit: 2017-03-27 | Discharge: 2017-03-29 | DRG: 807 | Disposition: A | Discharge status: Home / Self Care | Payer: Medicaid Other | Source: Ambulatory Visit | Attending: Obstetrics and Gynecology | Admitting: Obstetrics and Gynecology

## 2017-03-27 DIAGNOSIS — O99824 Streptococcus B carrier state complicating childbirth: Secondary | ICD-10-CM | POA: Diagnosis present

## 2017-03-27 DIAGNOSIS — O26893 Other specified pregnancy related conditions, third trimester: Secondary | ICD-10-CM | POA: Diagnosis present

## 2017-03-27 DIAGNOSIS — Z6791 Unspecified blood type, Rh negative: Secondary | ICD-10-CM

## 2017-03-27 DIAGNOSIS — D271 Benign neoplasm of left ovary: Secondary | ICD-10-CM | POA: Diagnosis present

## 2017-03-27 DIAGNOSIS — Z3A39 39 weeks gestation of pregnancy: Secondary | ICD-10-CM

## 2017-03-27 DIAGNOSIS — Z87891 Personal history of nicotine dependence: Secondary | ICD-10-CM

## 2017-03-27 DIAGNOSIS — Z3483 Encounter for supervision of other normal pregnancy, third trimester: Secondary | ICD-10-CM | POA: Diagnosis present

## 2017-03-27 DIAGNOSIS — O3483 Maternal care for other abnormalities of pelvic organs, third trimester: Secondary | ICD-10-CM | POA: Diagnosis present

## 2017-03-27 DIAGNOSIS — O99214 Obesity complicating childbirth: Secondary | ICD-10-CM | POA: Diagnosis present

## 2017-03-27 DIAGNOSIS — O479 False labor, unspecified: Secondary | ICD-10-CM | POA: Insufficient documentation

## 2017-03-27 LAB — CBC
HEMATOCRIT: 33.9 % — AB (ref 36.0–46.0)
HEMOGLOBIN: 11.1 g/dL — AB (ref 12.0–15.0)
MCH: 28.6 pg (ref 26.0–34.0)
MCHC: 32.7 g/dL (ref 30.0–36.0)
MCV: 87.4 fL (ref 78.0–100.0)
Platelets: 248 10*3/uL (ref 150–400)
RBC: 3.88 MIL/uL (ref 3.87–5.11)
RDW: 13.4 % (ref 11.5–15.5)
WBC: 11 10*3/uL — AB (ref 4.0–10.5)

## 2017-03-27 LAB — AMNISURE RUPTURE OF MEMBRANE (ROM) NOT AT ARMC: Amnisure ROM: POSITIVE

## 2017-03-27 MED ORDER — OXYCODONE-ACETAMINOPHEN 5-325 MG PO TABS: 2.0000 | ORAL_TABLET | ORAL | Status: DC | PRN

## 2017-03-27 MED ORDER — PENICILLIN G POTASSIUM 5000000 UNITS IJ SOLR
5.0000 10*6.[IU] | Freq: Once | INTRAVENOUS | Status: DC
  Filled 2017-03-27: qty 5

## 2017-03-27 MED ORDER — FLEET ENEMA 7-19 GM/118ML RE ENEM: 1.0000 | ENEMA | RECTAL | Status: DC | PRN

## 2017-03-27 MED ORDER — BENZOCAINE-MENTHOL 20-0.5 % EX AERO: 1.0000 "application " | INHALATION_SPRAY | CUTANEOUS | Status: DC | PRN

## 2017-03-27 MED ORDER — PHENYLEPHRINE 40 MCG/ML (10ML) SYRINGE FOR IV PUSH (FOR BLOOD PRESSURE SUPPORT)
80.0000 ug | PREFILLED_SYRINGE | INTRAVENOUS | Status: DC | PRN
  Filled 2017-03-27: qty 5

## 2017-03-27 MED ORDER — PENICILLIN G POTASSIUM 5000000 UNITS IJ SOLR
5.0000 10*6.[IU] | Freq: Once | INTRAMUSCULAR | Status: AC
  Administered 2017-03-27: 5 10*6.[IU] via INTRAVENOUS
  Filled 2017-03-27: qty 5

## 2017-03-27 MED ORDER — DIPHENHYDRAMINE HCL 50 MG/ML IJ SOLN: 12.5000 mg | INTRAMUSCULAR | Status: DC | PRN

## 2017-03-27 MED ORDER — FENTANYL CITRATE (PF) 100 MCG/2ML IJ SOLN
50.0000 ug | INTRAMUSCULAR | Status: DC | PRN
  Administered 2017-03-27: 100 ug via INTRAVENOUS
  Filled 2017-03-27: qty 2

## 2017-03-27 MED ORDER — ZOLPIDEM TARTRATE 5 MG PO TABS: 5.0000 mg | ORAL_TABLET | Freq: Every evening | ORAL | Status: DC | PRN

## 2017-03-27 MED ORDER — ONDANSETRON HCL 4 MG/2ML IJ SOLN: 4.0000 mg | Freq: Four times a day (QID) | INTRAMUSCULAR | Status: DC | PRN

## 2017-03-27 MED ORDER — PRENATAL MULTIVITAMIN CH
1.0000 | ORAL_TABLET | Freq: Every day | ORAL | Status: DC
  Administered 2017-03-28 – 2017-03-29 (×2): 1 via ORAL
  Filled 2017-03-27 (×2): qty 1

## 2017-03-27 MED ORDER — ACETAMINOPHEN 325 MG PO TABS: 650.0000 mg | ORAL_TABLET | ORAL | Status: DC | PRN

## 2017-03-27 MED ORDER — LIDOCAINE HCL (PF) 1 % IJ SOLN: 30.0000 mL | INTRAMUSCULAR | Status: DC | PRN

## 2017-03-27 MED ORDER — LACTATED RINGERS IV SOLN: 500.0000 mL | Freq: Once | INTRAVENOUS | Status: DC

## 2017-03-27 MED ORDER — OXYTOCIN 40 UNITS IN LACTATED RINGERS INFUSION - SIMPLE MED
2.5000 [IU]/h | INTRAVENOUS | Status: DC
  Filled 2017-03-27: qty 1000

## 2017-03-27 MED ORDER — SOD CITRATE-CITRIC ACID 500-334 MG/5ML PO SOLN: 30.0000 mL | ORAL | Status: DC | PRN

## 2017-03-27 MED ORDER — MEASLES, MUMPS & RUBELLA VAC ~~LOC~~ INJ: 0.5000 mL | INJECTION | Freq: Once | SUBCUTANEOUS | Status: DC

## 2017-03-27 MED ORDER — OXYTOCIN BOLUS FROM INFUSION
500.0000 mL | Freq: Once | INTRAVENOUS | Status: AC
  Administered 2017-03-27: 500 mL via INTRAVENOUS

## 2017-03-27 MED ORDER — PENICILLIN G POT IN DEXTROSE 60000 UNIT/ML IV SOLN
3.0000 10*6.[IU] | INTRAVENOUS | Status: DC
  Filled 2017-03-27 (×3): qty 50

## 2017-03-27 MED ORDER — LACTATED RINGERS IV SOLN: 500.0000 mL | INTRAVENOUS | Status: DC | PRN

## 2017-03-27 MED ORDER — MEDROXYPROGESTERONE ACETATE 150 MG/ML IM SUSP: 150.0000 mg | INTRAMUSCULAR | Status: DC | PRN

## 2017-03-27 MED ORDER — ONDANSETRON HCL 4 MG/2ML IJ SOLN: 4.0000 mg | INTRAMUSCULAR | Status: DC | PRN

## 2017-03-27 MED ORDER — PHENYLEPHRINE 40 MCG/ML (10ML) SYRINGE FOR IV PUSH (FOR BLOOD PRESSURE SUPPORT)
80.0000 ug | PREFILLED_SYRINGE | INTRAVENOUS | Status: DC | PRN
  Filled 2017-03-27: qty 10
  Filled 2017-03-27: qty 5

## 2017-03-27 MED ORDER — SENNOSIDES-DOCUSATE SODIUM 8.6-50 MG PO TABS
2.0000 | ORAL_TABLET | ORAL | Status: DC
  Administered 2017-03-27 – 2017-03-28 (×2): 2 via ORAL
  Filled 2017-03-27 (×2): qty 2

## 2017-03-27 MED ORDER — COCONUT OIL OIL: 1.0000 "application " | TOPICAL_OIL | Status: DC | PRN

## 2017-03-27 MED ORDER — EPHEDRINE 5 MG/ML INJ
10.0000 mg | INTRAVENOUS | Status: DC | PRN
  Filled 2017-03-27: qty 2

## 2017-03-27 MED ORDER — LACTATED RINGERS IV SOLN: INTRAVENOUS | Status: DC

## 2017-03-27 MED ORDER — PENICILLIN G POT IN DEXTROSE 60000 UNIT/ML IV SOLN
3.0000 10*6.[IU] | INTRAVENOUS | Status: DC
  Administered 2017-03-27: 3 10*6.[IU] via INTRAVENOUS
  Filled 2017-03-27 (×3): qty 50

## 2017-03-27 MED ORDER — WITCH HAZEL-GLYCERIN EX PADS: 1.0000 "application " | MEDICATED_PAD | CUTANEOUS | Status: DC | PRN

## 2017-03-27 MED ORDER — DIBUCAINE 1 % RE OINT: 1.0000 "application " | TOPICAL_OINTMENT | RECTAL | Status: DC | PRN

## 2017-03-27 MED ORDER — LIDOCAINE HCL (PF) 1 % IJ SOLN
30.0000 mL | INTRAMUSCULAR | Status: DC | PRN
  Filled 2017-03-27: qty 30

## 2017-03-27 MED ORDER — LACTATED RINGERS IV SOLN
500.0000 mL | INTRAVENOUS | Status: DC | PRN
  Administered 2017-03-27: 1000 mL via INTRAVENOUS

## 2017-03-27 MED ORDER — ONDANSETRON HCL 4 MG PO TABS: 4.0000 mg | ORAL_TABLET | ORAL | Status: DC | PRN

## 2017-03-27 MED ORDER — DIPHENHYDRAMINE HCL 25 MG PO CAPS: 25.0000 mg | ORAL_CAPSULE | Freq: Four times a day (QID) | ORAL | Status: DC | PRN

## 2017-03-27 MED ORDER — IBUPROFEN 600 MG PO TABS
600.0000 mg | ORAL_TABLET | Freq: Four times a day (QID) | ORAL | Status: DC
  Administered 2017-03-27 – 2017-03-29 (×7): 600 mg via ORAL
  Filled 2017-03-27 (×7): qty 1

## 2017-03-27 MED ORDER — OXYCODONE-ACETAMINOPHEN 5-325 MG PO TABS: 1.0000 | ORAL_TABLET | ORAL | Status: DC | PRN

## 2017-03-27 MED ORDER — OXYTOCIN BOLUS FROM INFUSION: 500.0000 mL | Freq: Once | INTRAVENOUS | Status: DC

## 2017-03-27 MED ORDER — SIMETHICONE 80 MG PO CHEW: 80.0000 mg | CHEWABLE_TABLET | ORAL | Status: DC | PRN

## 2017-03-27 MED ORDER — LIDOCAINE HCL (PF) 1 % IJ SOLN
INTRAMUSCULAR | Status: DC | PRN
  Administered 2017-03-27 (×2): 6 mL via EPIDURAL

## 2017-03-27 MED ORDER — FENTANYL 2.5 MCG/ML BUPIVACAINE 1/10 % EPIDURAL INFUSION (WH - ANES)
14.0000 mL/h | INTRAMUSCULAR | Status: DC | PRN
  Administered 2017-03-27: 14 mL/h via EPIDURAL
  Filled 2017-03-27: qty 100

## 2017-03-27 MED ORDER — OXYTOCIN 40 UNITS IN LACTATED RINGERS INFUSION - SIMPLE MED: 2.5000 [IU]/h | INTRAVENOUS | Status: DC

## 2017-03-27 NOTE — MAU Note (Signed)
Pt has been contracting since last night and leaking fluid today.

## 2017-03-27 NOTE — Anesthesia Preprocedure Evaluation (Signed)
Anesthesia Evaluation  Patient identified by MRN, date of birth, ID band Patient awake    Reviewed: Allergy & Precautions, NPO status , Patient's Chart, lab work & pertinent test results  History of Anesthesia Complications Negative for: history of anesthetic complications  Airway Mallampati: III  TM Distance: >3 FB Neck ROM: Full    Dental  (+) Dental Advisory Given   Pulmonary former smoker,    breath sounds clear to auscultation       Cardiovascular negative cardio ROS   Rhythm:Regular Rate:Normal     Neuro/Psych negative neurological ROS     GI/Hepatic Neg liver ROS, GERD  ,  Endo/Other  Morbid obesity  Renal/GU negative Renal ROS     Musculoskeletal   Abdominal (+) + obese,   Peds  Hematology plt 248k   Anesthesia Other Findings   Reproductive/Obstetrics (+) Pregnancy                             Anesthesia Physical Anesthesia Plan  ASA: II  Anesthesia Plan: Epidural   Post-op Pain Management:    Induction:   PONV Risk Score and Plan: Treatment may vary due to age or medical condition  Airway Management Planned: Natural Airway  Additional Equipment:   Intra-op Plan:   Post-operative Plan:   Informed Consent: I have reviewed the patients History and Physical, chart, labs and discussed the procedure including the risks, benefits and alternatives for the proposed anesthesia with the patient or authorized representative who has indicated his/her understanding and acceptance.   Dental advisory given  Plan Discussed with: CRNA and Surgeon  Anesthesia Plan Comments: (Patient identified. Risks/Benefits/Options discussed with patient including but not limited to bleeding, infection, nerve damage, paralysis, failed block, incomplete pain control, headache, blood pressure changes, nausea, vomiting, reactions to medication both or allergic, itching and postpartum back pain.  Confirmed with bedside nurse the patient's most recent platelet count. Confirmed with patient that they are not currently taking any anticoagulation, have any bleeding history or any family history of bleeding disorders. Patient expressed understanding and wished to proceed. All questions were answered. )        Anesthesia Quick Evaluation

## 2017-03-27 NOTE — Anesthesia Procedure Notes (Signed)
Epidural Patient location during procedure: OB Start time: 03/27/2017 2:36 PM End time: 03/27/2017 2:58 PM  Staffing Anesthesiologist: Annye Asa, MD Performed: anesthesiologist   Preanesthetic Checklist Completed: patient identified, surgical consent, pre-op evaluation, timeout performed, IV checked, risks and benefits discussed and monitors and equipment checked  Epidural Patient position: sitting Prep: site prepped and draped and DuraPrep Patient monitoring: blood pressure, continuous pulse ox and heart rate Approach: midline Location: L3-L4 Injection technique: LOR air  Needle:  Needle type: Tuohy  Needle gauge: 17 G Needle length: 9 cm Needle insertion depth: 5.5 cm Catheter type: closed end flexible Catheter size: 19 Gauge Catheter at skin depth: 11 cm Test dose: negative (1% lidocaine)  Assessment Events: blood not aspirated, injection not painful, no injection resistance, negative IV test and no paresthesia  Additional Notes Pt identified in Labor room.  Monitors applied. Working IV access confirmed. Sterile prep, drape lumbar spine.  1% lido local L 3,4.  #17ga Touhy LOR air at 5.5 cm L 3,4, cath in easily to 11 cm skin. Test dose OK, cath dosed and infusion begun.  Patient asymptomatic, VSS, no heme aspirated, tolerated well.  Jenita Seashore, MDReason for block:procedure for pain

## 2017-03-27 NOTE — H&P (Signed)
Sharon Browning is Browning 26 y.o. female presenting for labor and questionable ROM.  Clear fluid. OB History    Gravida Para Term Preterm AB Living   3 1 1   1 1    SAB TAB Ectopic Multiple Live Births   1     0 1     Past Medical History:  Diagnosis Date  . Ovarian cyst    Past Surgical History:  Procedure Laterality Date  . NO PAST SURGERIES     Family History: family history includes Asthma in her brother; Cancer in her paternal grandmother; Diabetes in her maternal grandmother; Hypertension in her maternal grandmother; Stroke in her maternal grandmother. Social History:  reports that she has quit smoking. Her smoking use included cigarettes. she has never used smokeless tobacco. She reports that she does not drink alcohol or use drugs.     Maternal Diabetes: No Genetic Screening: Normal Maternal Ultrasounds/Referrals left dermoid cyst Fetal Ultrasounds or other Referrals:  None Maternal Substance Abuse:  No Significant Maternal Medications:  None Significant Maternal Lab Results:  None Other Comments:  None  ROS History Dilation: 4 Effacement (%): 90 Station: -1 Exam by:: Dr Charlesetta Garibaldi Blood pressure 106/88, pulse (!) 111, temperature 97.9 F (36.6 C), resp. rate 20, last menstrual period 05/31/2016, unknown if currently breastfeeding. Exam Physical Exam  Physical Examination: General appearance - alert, well appearing, and in no distress Chest - clear to auscultation, no wheezes, rales or rhonchi, symmetric air entry Heart - normal rate and regular rhythm Abdomen - soft, nontender, nondistended, no masses or organomegaly gravid Pelvic - normal external genitalia, vulva, vagina, cervix, uterus and adnexa, SSE no pooling Extremities - Homan's sign negative bilaterally Prenatal labs: ABO, Rh: --/--/B NEG (04/29 1308) Antibody:   Rubella:   RPR:    HBsAg:    HIV:    GBS:     Assessment/Plan: Active labor Cat 1 with some early decels Epidural prn GBS pos start  PCN RH neg pt received rhophylac HSV pos did not take valtrex.  No prodrome or HSV lesions seen with SSE Anticipate SVD   Sharon Browning 03/27/2017, 1:57 PM

## 2017-03-28 LAB — RPR: RPR Ser Ql: NONREACTIVE

## 2017-03-28 LAB — CBC
HEMATOCRIT: 33.3 % — AB (ref 36.0–46.0)
HEMOGLOBIN: 11 g/dL — AB (ref 12.0–15.0)
MCH: 28.6 pg (ref 26.0–34.0)
MCHC: 33 g/dL (ref 30.0–36.0)
MCV: 86.7 fL (ref 78.0–100.0)
Platelets: 277 10*3/uL (ref 150–400)
RBC: 3.84 MIL/uL — ABNORMAL LOW (ref 3.87–5.11)
RDW: 13.4 % (ref 11.5–15.5)
WBC: 11.6 10*3/uL — AB (ref 4.0–10.5)

## 2017-03-28 MED ORDER — RHO D IMMUNE GLOBULIN 1500 UNIT/2ML IJ SOSY
300.0000 ug | PREFILLED_SYRINGE | Freq: Once | INTRAMUSCULAR | Status: AC
  Administered 2017-03-28: 300 ug via INTRAVENOUS
  Filled 2017-03-28: qty 2

## 2017-03-28 MED ORDER — OXYCODONE-ACETAMINOPHEN 5-325 MG PO TABS: 2.0000 | ORAL_TABLET | ORAL | Status: DC | PRN

## 2017-03-28 MED ORDER — OXYCODONE-ACETAMINOPHEN 5-325 MG PO TABS
1.0000 | ORAL_TABLET | ORAL | Status: DC | PRN
  Administered 2017-03-28: 1 via ORAL
  Filled 2017-03-28: qty 1

## 2017-03-28 NOTE — Anesthesia Postprocedure Evaluation (Signed)
Anesthesia Post Note  Patient: Medical sales representative  Procedure(s) Performed: AN AD Joes     Patient location during evaluation: Mother Baby Anesthesia Type: Epidural Level of consciousness: awake and alert and oriented Pain management: pain level controlled Vital Signs Assessment: post-procedure vital signs reviewed and stable Respiratory status: spontaneous breathing and nonlabored ventilation Cardiovascular status: stable Postop Assessment: no headache, adequate PO intake, no backache, epidural receding, patient able to bend at knees and no apparent nausea or vomiting Anesthetic complications: no    Last Vitals:  Vitals:   03/28/17 0134 03/28/17 0605  BP: 121/74 129/64  Pulse: 84 88  Resp: 18 18  Temp: 36.9 C 36.7 C  SpO2: 99% 99%    Last Pain:  Vitals:   03/28/17 0605  TempSrc: Oral  PainSc:    Pain Goal:                 Jabier Mutton

## 2017-03-28 NOTE — Progress Notes (Signed)
Post Partum Day 1 Subjective: no complaints GBS pos  Objective: Blood pressure 129/64, pulse 88, temperature 98.1 F (36.7 C), temperature source Oral, resp. rate 18, height 5\' 5"  (1.651 m), weight 221 lb (100.2 kg), last menstrual period 05/31/2016, SpO2 99 %, unknown if currently breastfeeding.  Physical Exam:  General: alert, cooperative and appears stated age Lochia: appropriate Uterine Fundus: firm Incision:  DVT Evaluation: No evidence of DVT seen on physical exam.  Recent Labs    03/27/17 1416 03/28/17 0514  HGB 11.1* 11.0*  HCT 33.9* 33.3*    Assessment/Plan: Plan for discharge tomorrow and Breastfeeding Outpatient circ  LOS: 1 day   Kendell Gammon A Nyilah Kight CNM 03/28/2017, 7:52 AM

## 2017-03-29 LAB — RH IG WORKUP (INCLUDES ABO/RH)
ABO/RH(D): B NEG
Fetal Screen: NEGATIVE
Gestational Age(Wks): 39
UNIT DIVISION: 0

## 2017-03-29 MED ORDER — MEDROXYPROGESTERONE ACETATE 150 MG/ML IM SUSP
150.0000 mg | Freq: Once | INTRAMUSCULAR | Status: AC
  Administered 2017-03-29: 150 mg via INTRAMUSCULAR
  Filled 2017-03-29: qty 1

## 2017-03-29 MED ORDER — IBUPROFEN 600 MG PO TABS: 600.0000 mg | ORAL_TABLET | Freq: Four times a day (QID) | ORAL | 0 refills | Status: DC | PRN

## 2017-03-29 NOTE — Discharge Instructions (Signed)
Postpartum Depression and Baby Blues The postpartum period begins right after the birth of a baby. During this time, there is often a great amount of joy and excitement. It is also a time of many changes in the life of the parents. Regardless of how many times a mother gives birth, each child brings new challenges and dynamics to the family. It is not unusual to have feelings of excitement along with confusing shifts in moods, emotions, and thoughts. All mothers are at risk of developing postpartum depression or the "baby blues." These mood changes can occur right after giving birth, or they may occur many months after giving birth. The baby blues or postpartum depression can be mild or severe. Additionally, postpartum depression can go away rather quickly, or it can be a long-term condition. What are the causes? Raised hormone levels and the rapid drop in those levels are thought to be a main cause of postpartum depression and the baby blues. A number of hormones change during and after pregnancy. Estrogen and progesterone usually decrease right after the delivery of your baby. The levels of thyroid hormone and various cortisol steroids also rapidly drop. Other factors that play a role in these mood changes include major life events and genetics. What increases the risk? If you have any of the following risks for the baby blues or postpartum depression, know what symptoms to watch out for during the postpartum period. Risk factors that may increase the likelihood of getting the baby blues or postpartum depression include:  Having a personal or family history of depression.  Having depression while being pregnant.  Having premenstrual mood issues or mood issues related to oral contraceptives.  Having a lot of life stress.  Having marital conflict.  Lacking a social support network.  Having a baby with special needs.  Having health problems, such as diabetes.  What are the signs or  symptoms? Symptoms of baby blues include:  Brief changes in mood, such as going from extreme happiness to sadness.  Decreased concentration.  Difficulty sleeping.  Crying spells, tearfulness.  Irritability.  Anxiety.  Symptoms of postpartum depression typically begin within the first month after giving birth. These symptoms include:  Difficulty sleeping or excessive sleepiness.  Marked weight loss.  Agitation.  Feelings of worthlessness.  Lack of interest in activity or food.  Postpartum psychosis is a very serious condition and can be dangerous. Fortunately, it is rare. Displaying any of the following symptoms is cause for immediate medical attention. Symptoms of postpartum psychosis include:  Hallucinations and delusions.  Bizarre or disorganized behavior.  Confusion or disorientation.  How is this diagnosed? A diagnosis is made by an evaluation of your symptoms. There are no medical or lab tests that lead to a diagnosis, but there are various questionnaires that a health care provider may use to identify those with the baby blues, postpartum depression, or psychosis. Often, a screening tool called the [place] Postnatal Depression Scale is used to diagnose depression in the postpartum period. How is this treated? The baby blues usually goes away on its own in 1-2 weeks. Social support is often all that is needed. You will be encouraged to get adequate sleep and rest. Occasionally, you may be given medicines to help you sleep. Postpartum depression requires treatment because it can last several months or longer if it is not treated. Treatment may include individual or group therapy, medicine, or both to address any social, physiological, and psychological factors that may play a role in the  depression. Regular exercise, a healthy diet, rest, and social support may also be strongly recommended. Postpartum psychosis is more serious and needs treatment right away.  Hospitalization is often needed. Follow these instructions at home:  Get as much rest as you can. Nap when the baby sleeps.  Exercise regularly. Some women find yoga and walking to be beneficial.  Eat a balanced and nourishing diet.  Do little things that you enjoy. Have a cup of tea, take a bubble bath, read your favorite magazine, or listen to your favorite music.  Avoid alcohol.  Ask for help with household chores, cooking, grocery shopping, or running errands as needed. Do not try to do everything.  Talk to people close to you about how you are feeling. Get support from your partner, family members, friends, or other new moms.  Try to stay positive in how you think. Think about the things you are grateful for.  Do not spend a lot of time alone.  Only take over-the-counter or prescription medicine as directed by your health care provider.  Keep all your postpartum appointments.  Let your health care provider know if you have any concerns. Contact a health care provider if: You are having a reaction to or problems with your medicine. Get help right away if:  You have suicidal feelings.  You think you may harm the baby or someone else. This information is not intended to replace advice given to you by your health care provider. Make sure you discuss any questions you have with your health care provider. Document Released: 01/05/2004 Document Revised: 09/08/2015 Document Reviewed: 01/12/2013 Elsevier Interactive Patient Education  2017 [employer]  

## 2017-03-29 NOTE — Plan of Care (Signed)
Patient was having anxiety regarding infant's weight loss.  Her mood has improved since starting to formula feed as supplementation.  Will continue to provide support and monitor.

## 2017-03-29 NOTE — Lactation Note (Signed)
This note was copied from a baby's chart. Lactation Consultation Note  Patient Name: Sharon Browning [id number]'T Date: 03/29/2017 Reason for consult: Follow-up assessment;Nipple pain/trauma;Difficult latch;Infant weight loss   Follow up with mom of 69 hour old infant. Infant with 3 BF for 10-12 minutes, 4 bf Attempts, EBM x 5 of 0.5-7 cc, formula x 1 of 12 cc, 3 voids and 4 stools in last 24 hours. Infant weight 6 lb 1 oz with 8% weight loss since birth. LATCH scores 3-4.   Mom reports she is switching to bottle. Mom reports she is concerned with infant weight loss. Mom declined BF assistance at this time. Offered to set up DEBP, mom declined and said she does not have a pump at home. Offered [facility] loaner and mom declined. Mom has a manual pump to take home.   Reviewed I/O. Signs of dehydration in the infant, Engorgement prevention/treatment/drying up milk, and breast milk expression and storage.   Mom reports she has no further questions/concerns at this time. Mom is a [facility] client and has an appt on Feb. Mom to call [place] to change package. Message given to [facility] reps to follow up with mom in hospital if able. Mom has [place] phone # to call with any questions/concerns as needed.    Maternal Data Has patient been taught Hand Expression?: Yes Does the patient have breastfeeding experience prior to this delivery?: Yes  Feeding    LATCH Score                   Interventions    Lactation Tools Discussed/Used WIC Program: Yes   Consult Status Consult Status: Complete Follow-up type: Call as needed    Donn Pierini 03/29/2017, 10:38 AM

## 2017-03-29 NOTE — Lactation Note (Addendum)
This note was copied from a baby's chart. Lactation Consultation Note Baby 69 hrs old. Had 8% weight loss. Appears to be slightly jaundice, but bili level WDL per RN.  Output since birth 2 voids, 4 stools. Mom stated baby has become less interested in BF. Baby is sleepy.  Mom has tubular breast w/ flat nipples. Rt. Nipple bruised, tender, Lt. Nipple looks red and raw. Mom states baby feed on Lt. Breast usually when feeds. Has hard time feding on Rt. Mom has been BF in cradle position. Encouraged to BF in football position for a short time until good feeding patterns established.  Mom has shells but not wearing them. Encouraged to wear. Comfort gels given for nipples.  Assisted in football position, discussed support and positioning while feeding.  Fitted w#16 NS. Hand expressed 0.5 ml colostrum. W/syring inserted into NS to stimulate baby to suckle on breast.  Baby finally started suckling, baby quickly stopped and slept after no colostrum coming. Before inserted colostrum no transfer noted in NS. Started 26Fr. Supplementing d/t weight loss, sleepiness, decreased output, not BF well. LEAD discussed, mom states understanding.  Gerber formula 12 ml inserted, baby only took 65m feeding on the breast. Involved FOB at bedside demonstrated using 5 Fr feeding into NS.  Gave 664mfinger fed w/22f98fBaby took well, but needed stimulating to get started.  Encouraged to alert staff if baby isn't feeding well, or wanting to BF. DEBP kit brought to rm. Asked RN to set pump up.  Encouraged to pump every 3 hrs for 15 min. Supplement every 3 hrs, if baby cues to feed, BF between that time w/o supplementing.  Unless colostrum. Newborn behavior reviewed, I&O, STS, cluster feeding, supply and demand. Mom encouraged to feed baby 8-12 times/24 hours and with feeding cues.  [facility] given w/resources, support groups and [facility] [place]. Reported to RN  Baby appears to may have a posterior tight frenulum. Slight heart  indention to tip of tongue. Can curl tongue back well. Have thick labial frenulum. Upper lip tissue appears to be thick, not able to flange wide, skin slightly rolls in layers when cries.   Patient Name: Boy Sharon Browning: 03/29/2017 Reason for consult: Initial assessment;Infant weight loss   Maternal Data Has patient been taught Hand Expression?: Yes Does the patient have breastfeeding experience prior to this delivery?: No  Feeding Feeding Type: Formula Length of feed: 8 min  LATCH Score Latch: Repeated attempts needed to sustain latch, nipple held in mouth throughout feeding, stimulation needed to elicit sucking reflex.  Audible Swallowing: None  Type of Nipple: Flat  Comfort (Breast/Nipple): Filling, red/small blisters or bruises, mild/mod discomfort  Hold (Positioning): Full assist, staff holds infant at breast  LATCH Score: 3  Interventions Interventions: Breast feeding basics reviewed;Breast compression;Comfort gels;Assisted with latch;Adjust position;Hand pump;Skin to skin;Support pillows;DEBP;Breast massage;Position options;Hand express;Expressed milk;Pre-pump if needed;Shells(asked RN Therapist, sports set up DE[place] in rm.)  Lactation Tools Discussed/Used Tools: Shells;Pump;26F feeding tube / Syringe;Comfort gels;Nipple Shields(encouraged wear shells) Nipple shield size: 16 Shell Type: Inverted Breast pump type: Manual WIC Program: Yes Pump Review: Milk Storage   Consult Status Consult Status: Follow-up Date: 03/29/17 Follow-up type: In-patient    Sharon Browning, LAUElta Browning/14/2018, 5:14 AM

## 2017-03-29 NOTE — Discharge Summary (Signed)
OB Discharge Summary     Patient Name: Sharon Browning DOB: Feb 28, 1991 MRN: [id number]  Date of admission: 03/27/2017 Delivering MD: Crawford Givens   Date of discharge: 03/29/2017  Admitting diagnosis: 41.5WKS CTX, POSSIBLE WATER BROKE Intrauterine pregnancy: [redacted]w[redacted]d     Secondary diagnosis:  Principal Problem:   SVD (spontaneous vaginal delivery) Active Problems:   Indication for care or intervention related to labor and delivery  Additional problems: Rh Negative     Discharge diagnosis: Term Pregnancy Delivered                                                                                                Post partum procedures:rhogam  Augmentation: None  Complications: None  Hospital course:  Onset of Labor With Vaginal Delivery     26 y.o. yo [id number] at [redacted]w[redacted]d was admitted in Active Labor on 03/27/2017. Patient had an uncomplicated labor course as follows:  Membrane Rupture Time/Date: 1:00 PM ,03/27/2017   Intrapartum Procedures: Episiotomy: None [1]                                         Lacerations:  None [1]  Patient had a delivery of a Viable infant. 03/27/2017  Information for the patient's newborn:  Analee, Montee Anjalee [[id number]]  Delivery Method: Vaginal, Spontaneous(Filed from Delivery Summary)    Pateint had an uncomplicated postpartum course.  She is ambulating, tolerating a regular diet, passing flatus, and urinating well. Patient is discharged home in stable condition on 03/29/17.   Physical exam  Vitals:   03/27/17 2131 03/28/17 0134 03/28/17 0605 03/28/17 1906  BP: 124/69 121/74 129/64 133/72  Pulse: (!) 102 84 88 91  Resp: 16 18 18 18   Temp: 98.9 F (37.2 C) 98.5 F (36.9 C) 98.1 F (36.7 C) 98.2 F (36.8 C)  TempSrc: Oral Oral Oral Oral  SpO2: 99% 99% 99%   Weight:      Height:       General: alert, cooperative and no distress  Chest: HRRR, Lungs CTA Abd: Soft, BS Present Lochia: appropriate Uterine Fundus: firm Incision: N/A DVT  Evaluation: No evidence of DVT seen on physical exam. Calf/Ankle edema is present Labs: Lab Results  Component Value Date   WBC 11.6 (H) 03/28/2017   HGB 11.0 (L) 03/28/2017   HCT 33.3 (L) 03/28/2017   MCV 86.7 03/28/2017   PLT 277 03/28/2017   CMP Latest Ref Rng & Units 03/18/2017  Glucose 65 - 99 mg/dL 80  BUN 6 - 20 mg/dL 7  Creatinine 0.44 - 1.00 mg/dL 0.66  Sodium 135 - 145 mmol/L 132(L)  Potassium 3.5 - 5.1 mmol/L 4.2  Chloride 101 - 111 mmol/L 101  CO2 22 - 32 mmol/L 21(L)  Calcium 8.9 - 10.3 mg/dL 9.1  Total Protein 6.5 - 8.1 g/dL 7.8  Total Bilirubin 0.3 - 1.2 mg/dL 0.6  Alkaline Phos 38 - 126 U/L 246(H)  AST 15 - 41 U/L 22  ALT 14 - 54 U/L 26  Discharge instruction: per After Visit Summary and "Baby and Me Booklet". Pain Management, Peri-Care, Breastfeeding, Who and When to call for postpartum complications. Information Sheet(s) given PPD& BB.   After visit meds:  Allergies as of 03/29/2017      Reactions   Sulfur Hives      Medication List    TAKE these medications   ibuprofen 600 MG tablet Commonly known as:  ADVIL,MOTRIN Take 1 tablet (600 mg total) by mouth every 6 (six) hours as needed.   prenatal multivitamin Tabs tablet Take 1 tablet by mouth daily at 12 noon.       Diet: routine diet  Activity: Advance as tolerated. Pelvic rest for 6 weeks.   Outpatient follow up:6 weeks Follow up Appt:No future appointments. Follow up Visit:No Follow-up on file.  Postpartum contraception: Depo Provera  Newborn Data: Live born female  Birth Weight: 6 lb 10 oz (3005 g) APGAR: 9, 9  Newborn Delivery   Birth date/time:  03/27/2017 18:21:00 Delivery type:  Vaginal, Spontaneous     Baby Feeding: Bottle and Breast Disposition:home with mother   03/29/2017 Maryann Conners, CNM

## 2017-03-31 LAB — TYPE AND SCREEN
ABO/RH(D): B NEG
Antibody Screen: POSITIVE
UNIT DIVISION: 0
UNIT DIVISION: 0

## 2017-03-31 LAB — BPAM RBC
BLOOD PRODUCT EXPIRATION DATE: 201812302359
Blood Product Expiration Date: 201812272359
Unit Type and Rh: 9500
Unit Type and Rh: 9500

## 2017-04-25 ENCOUNTER — Encounter (HOSPITAL_COMMUNITY): Payer: Self-pay | Admitting: *Deleted

## 2017-04-25 ENCOUNTER — Inpatient Hospital Stay (HOSPITAL_COMMUNITY)
Admission: AD | Admit: 2017-04-25 | Discharge: 2017-04-26 | Disposition: A | Discharge status: Home / Self Care | Payer: Medicaid Other | Source: Ambulatory Visit | Attending: Obstetrics and Gynecology | Admitting: Obstetrics and Gynecology

## 2017-04-25 ENCOUNTER — Emergency Department (HOSPITAL_COMMUNITY): Payer: Medicaid Other

## 2017-04-25 ENCOUNTER — Encounter (HOSPITAL_COMMUNITY): Payer: Self-pay

## 2017-04-25 ENCOUNTER — Other Ambulatory Visit: Payer: Self-pay

## 2017-04-25 DIAGNOSIS — R1032 Left lower quadrant pain: Secondary | ICD-10-CM | POA: Insufficient documentation

## 2017-04-25 DIAGNOSIS — Z9889 Other specified postprocedural states: Principal | ICD-10-CM

## 2017-04-25 DIAGNOSIS — Z5321 Procedure and treatment not carried out due to patient leaving prior to being seen by health care provider: Secondary | ICD-10-CM | POA: Insufficient documentation

## 2017-04-25 DIAGNOSIS — Z86018 Personal history of other benign neoplasm: Secondary | ICD-10-CM

## 2017-04-25 LAB — COMPREHENSIVE METABOLIC PANEL
ALBUMIN: 3.5 g/dL (ref 3.5–5.0)
ALK PHOS: 117 U/L (ref 38–126)
ALT: 67 U/L — AB (ref 14–54)
AST: 37 U/L (ref 15–41)
Anion gap: 9 (ref 5–15)
BUN: 7 mg/dL (ref 6–20)
CALCIUM: 9 mg/dL (ref 8.9–10.3)
CO2: 22 mmol/L (ref 22–32)
CREATININE: 1.1 mg/dL — AB (ref 0.44–1.00)
Chloride: 107 mmol/L (ref 101–111)
GFR calc Af Amer: >60 mL/min (ref >60)
GFR calc non Af Amer: >60 mL/min (ref >60)
GLUCOSE: 90 mg/dL (ref 65–99)
Potassium: 3.7 mmol/L (ref 3.5–5.1)
SODIUM: 138 mmol/L (ref 135–145)
Total Bilirubin: 0.7 mg/dL (ref 0.3–1.2)
Total Protein: 7.2 g/dL (ref 6.5–8.1)

## 2017-04-25 LAB — WET PREP, GENITAL
Sperm: NONE SEEN
Trich, Wet Prep: NONE SEEN
Yeast Wet Prep HPF POC: NONE SEEN

## 2017-04-25 LAB — URINALYSIS, ROUTINE W REFLEX MICROSCOPIC
Bilirubin Urine: NEGATIVE
GLUCOSE, UA: NEGATIVE mg/dL
KETONES UR: NEGATIVE mg/dL
Nitrite: NEGATIVE
PH: 5 (ref 5.0–8.0)
PROTEIN: NEGATIVE mg/dL
Specific Gravity, Urine: 1.02 (ref 1.005–1.030)

## 2017-04-25 LAB — LIPASE, BLOOD: LIPASE: 39 U/L (ref 11–51)

## 2017-04-25 LAB — CBC
HCT: 39.9 % (ref 36.0–46.0)
Hemoglobin: 13.2 g/dL (ref 12.0–15.0)
MCH: 27.9 pg (ref 26.0–34.0)
MCHC: 33.1 g/dL (ref 30.0–36.0)
MCV: 84.4 fL (ref 78.0–100.0)
PLATELETS: 264 10*3/uL (ref 150–400)
RBC: 4.73 MIL/uL (ref 3.87–5.11)
RDW: 13.3 % (ref 11.5–15.5)
WBC: 7.3 10*3/uL (ref 4.0–10.5)

## 2017-04-25 LAB — I-STAT BETA HCG BLOOD, ED (MC, WL, AP ONLY): I-stat hCG, quantitative: <5 m[IU]/mL (ref <5)

## 2017-04-25 MED ORDER — OXYCODONE-ACETAMINOPHEN 5-325 MG PO TABS
1.0000 | ORAL_TABLET | Freq: Once | ORAL | Status: AC
  Administered 2017-04-25: 1 via ORAL
  Filled 2017-04-25: qty 1

## 2017-04-25 NOTE — ED Triage Notes (Signed)
Pt reports LLQ pain that radiates to back since Tuesday. Emesis x 4. Last BM tues. Denies urinary symptoms. Vaginal delivery dec 12. Hx of ovarian cyst. Denies vaginal bleeding

## 2017-04-25 NOTE — ED Notes (Addendum)
Called patient for recheck of vital signs and had no answer.

## 2017-04-25 NOTE — MAU Note (Signed)
Pt reports left lower abd pain that radiates to her back, vomited yesterdayx 4

## 2017-04-25 NOTE — MAU Provider Note (Signed)
History     CSN: [id number]  Arrival date & time 04/25/17  1818   None     Chief Complaint  Patient presents with  . Abdominal Pain    Sharon Browning 27 y.o. [id number] approximately 4 weeks postpartum presents to [facility] after signing in at [facility] ER and waiting for 6 hours , she decided to come here. She is experiencing pain in her lower abdomen which is diffuse and sometimes in her left lower back. She states she thre up 4 times yesterday related to pain. She has not thrown up today but also has not eaten anything. She states she has more discomfort when she stands and walks.  She has a history of a known dermoid cyst. Labs were drawn at [facility] ER approximately 4pm    Past Medical History:  Diagnosis Date  . Ovarian cyst     Past Surgical History:  Procedure Laterality Date  . NO PAST SURGERIES      Family History  Problem Relation Age of Onset  . Asthma Brother   . Diabetes Maternal Grandmother   . Hypertension Maternal Grandmother   . Stroke Maternal Grandmother   . Cancer Paternal Grandmother        breast    Social History   Tobacco Use  . Smoking status: Former Smoker    Types: Cigarettes  . Smokeless tobacco: Never Used  . Tobacco comment: mid 2014  Substance Use Topics  . Alcohol use: No  . Drug use: No    OB History    Gravida Para Term Preterm AB Living   3 2 2   1 2    SAB TAB Ectopic Multiple Live Births   1     0 2      Review of Systems  Gastrointestinal: Positive for abdominal pain, constipation and vomiting.  Genitourinary: Positive for pelvic pain.  Musculoskeletal: Positive for back pain.  All other systems reviewed and are negative.   Allergies  Sulfur  Home Medications    BP 125/85 (BP Location: Right Arm)   Pulse 82   Temp 98.3 F (36.8 C) (Oral)   Resp 16   Ht 5\' 5"  (1.651 m)   Wt 206 lb (93.4 kg)   SpO2 100%   BMI 34.28 kg/m   Physical Exam  Constitutional: She is oriented to person, place, and  time. She appears well-developed and well-nourished.  HENT:  Head: Normocephalic and atraumatic.  Neck: Normal range of motion.  Cardiovascular: Normal rate and regular rhythm.  Pulmonary/Chest: Effort normal and breath sounds normal. No respiratory distress.  Abdominal: Soft. There is tenderness.  Genitourinary: Vaginal discharge found.  Musculoskeletal: Normal range of motion.  Neurological: She is alert and oriented to person, place, and time.  Skin: Skin is warm and dry.  Psychiatric: She has a normal mood and affect. Her behavior is normal. Thought content normal.  Nursing note and vitals reviewed.  US Pelvic Complete With Transvaginal  Result Date: 04/25/2017 CLINICAL DATA:  27 year old female with left lower quadrant and back pain. EXAM: TRANSABDOMINAL AND TRANSVAGINAL ULTRASOUND OF PELVIS TECHNIQUE: Both transabdominal and transvaginal ultrasound examinations of the pelvis were performed. Transabdominal technique was performed for global imaging of the pelvis including uterus, ovaries, adnexal regions, and pelvic cul-de-sac. It was necessary to proceed with endovaginal exam following the transabdominal exam to visualize the endometrium and ovaries. COMPARISON:  Pelvic ultrasound dated 08/12/2016 FINDINGS: Uterus Measurements: 7.1 x 5.1 x 6.8 cm. The uterus appears retroflexed otherwise  unremarkable. Endometrium Thickness: 3 mm.  The endometrium is unremarkable as visualized. Right ovary Measurements: 3.1 x 2.0 x 2.0 cm. There is a 7.5 x 6.7 x 8.0 cm complex echogenic mass in the region of the right adnexa compatible with known dermoid. This dermoid can predispose the ovary to torsion. Left ovary Measurements: 3.5 x 2.6 x 1.7 cm. Normal appearance/no adnexal mass. Other findings No abnormal free fluid. IMPRESSION: 1. Unremarkable uterus and ovaries. 2. Right adnexal dermoid lesion. Electronically Signed   By: Anner Crete M.D.   On: 04/25/2017 23:54   Results for orders placed or  performed during the hospital encounter of 04/25/17 (from the past 24 hour(s))  Wet prep, genital     Status: Abnormal   Collection Time: 04/25/17  9:35 PM  Result Value Ref Range   Yeast Wet Prep HPF POC NONE SEEN NONE SEEN   Trich, Wet Prep NONE SEEN NONE SEEN   Clue Cells Wet Prep HPF POC PRESENT (A) NONE SEEN   WBC, Wet Prep HPF POC MODERATE (A) NONE SEEN   Sperm NONE SEEN     MAU Course  Procedures (including critical care time)  Labs Reviewed  WET PREP, GENITAL - Abnormal; Notable for the following components:      Result Value   Clue Cells Wet Prep HPF POC PRESENT (*)    WBC, Wet Prep HPF POC MODERATE (*)    All other components within normal limits  GC/CHLAMYDIA PROBE AMP () NOT AT [facility]   No results found. CBC    Component Value Date/Time   WBC 7.3 04/25/2017 1616   RBC 4.73 04/25/2017 1616   HGB 13.2 04/25/2017 1616   HCT 39.9 04/25/2017 1616   PLT 264 04/25/2017 1616   MCV 84.4 04/25/2017 1616   MCH 27.9 04/25/2017 1616   MCHC 33.1 04/25/2017 1616   RDW 13.3 04/25/2017 1616   LYMPHSABS 2.2 03/18/2017 1455   MONOABS 0.4 03/18/2017 1455   EOSABS 0.0 03/18/2017 1455   BASOSABS 0.0 03/18/2017 1455   CMP     Component Value Date/Time   NA 138 04/25/2017 1616   K 3.7 04/25/2017 1616   CL 107 04/25/2017 1616   CO2 22 04/25/2017 1616   GLUCOSE 90 04/25/2017 1616   BUN 7 04/25/2017 1616   CREATININE 1.10 (H) 04/25/2017 1616   CALCIUM 9.0 04/25/2017 1616   PROT 7.2 04/25/2017 1616   ALBUMIN 3.5 04/25/2017 1616   AST 37 04/25/2017 1616   ALT 67 (H) 04/25/2017 1616   ALKPHOS 117 04/25/2017 1616   BILITOT 0.7 04/25/2017 1616   GFRNONAA >60 04/25/2017 1616   GFRAA >60 04/25/2017 1616   Lipase     Component Value Date/Time   LIPASE 39 04/25/2017 1616   Negative istat Beta HCG Urinalysis    Component Value Date/Time   COLORURINE YELLOW 04/25/2017 1625   APPEARANCEUR HAZY (A) 04/25/2017 1625   LABSPEC 1.020 04/25/2017 1625   PHURINE 5.0  04/25/2017 1625   GLUCOSEU NEGATIVE 04/25/2017 1625   HGBUR SMALL (A) 04/25/2017 1625   BILIRUBINUR NEGATIVE 04/25/2017 1625   KETONESUR NEGATIVE 04/25/2017 1625   PROTEINUR NEGATIVE 04/25/2017 1625   NITRITE NEGATIVE 04/25/2017 1625   LEUKOCYTESUR LARGE (A) 04/25/2017 1625     1. History of dermoid cyst excision       MDM    1. Right adnexal Dermoid Lesion 2. Bacterial Vaginitis  Flagyl 500mg  po BID x 7 days Percocet 5/325mg  Follow up in OBGYN office for evaluation  and recommendations   Yvonne Kendall CNM

## 2017-04-26 ENCOUNTER — Emergency Department (HOSPITAL_COMMUNITY)
Admission: EM | Admit: 2017-04-26 | Discharge: 2017-04-26 | Disposition: A | Discharge status: Home / Self Care | Payer: Medicaid Other | Attending: Emergency Medicine | Admitting: Emergency Medicine

## 2017-04-26 LAB — GC/CHLAMYDIA PROBE AMP (~~LOC~~) NOT AT ARMC
Chlamydia: NEGATIVE
Neisseria Gonorrhea: NEGATIVE

## 2017-04-26 MED ORDER — METRONIDAZOLE 500 MG PO TABS: 500.0000 mg | ORAL_TABLET | Freq: Two times a day (BID) | ORAL | 0 refills | Status: DC

## 2017-04-26 MED ORDER — PROMETHAZINE HCL 25 MG PO TABS: 25.0000 mg | ORAL_TABLET | Freq: Four times a day (QID) | ORAL | 1 refills | Status: DC | PRN

## 2017-04-26 MED ORDER — OXYCODONE-ACETAMINOPHEN 5-325 MG PO TABS: 1.0000 | ORAL_TABLET | Freq: Four times a day (QID) | ORAL | 0 refills | Status: DC | PRN

## 2017-04-26 NOTE — ED Notes (Signed)
Pt called x3 no reply.

## 2017-04-26 NOTE — Discharge Instructions (Signed)
Ovarian Cyst An ovarian cyst is a fluid-filled sac that forms on an ovary. The ovaries are small organs that produce eggs in women. Various types of cysts can form on the ovaries. Some may cause symptoms and require treatment. Most ovarian cysts go away on their own, are not cancerous (are benign), and do not cause problems. Common types of ovarian cysts include:  Functional (follicle) cysts. ? Occur during the menstrual cycle, and usually go away with the next menstrual cycle if you do not get pregnant. ? Usually cause no symptoms.  Endometriomas. ? Are cysts that form from the tissue that lines the uterus (endometrium). ? Are sometimes called "chocolate cysts" because they become filled with blood that turns brown. ? Can cause pain in the lower abdomen during intercourse and during your period.  Cystadenoma cysts. ? Develop from cells on the outside surface of the ovary. ? Can get very large and cause lower abdomen pain and pain with intercourse. ? Can cause severe pain if they twist or break open (rupture).  Dermoid cysts. ? Are sometimes found in both ovaries. ? May contain different kinds of body tissue, such as skin, teeth, hair, or cartilage. ? Usually do not cause symptoms unless they get very big.  Theca lutein cysts. ? Occur when too much of a certain hormone (human chorionic gonadotropin) is produced and overstimulates the ovaries to produce an egg. ? Are most common after having procedures used to assist with the conception of a baby (in vitro fertilization).  What are the causes? Ovarian cysts may be caused by:  Ovarian hyperstimulation syndrome. This is a condition that can develop from taking fertility medicines. It causes multiple large ovarian cysts to form.  Polycystic ovarian syndrome (PCOS). This is a common hormonal disorder that can cause ovarian cysts, as well as problems with your period or fertility.  What increases the risk? The following factors may make  you more likely to develop ovarian cysts:  Being overweight or obese.  Taking fertility medicines.  Taking certain forms of hormonal birth control.  Smoking.  What are the signs or symptoms? Many ovarian cysts do not cause symptoms. If symptoms are present, they may include:  Pelvic pain or pressure.  Pain in the lower abdomen.  Pain during sex.  Abdominal swelling.  Abnormal menstrual periods.  Increasing pain with menstrual periods.  How is this diagnosed? These cysts are commonly found during a routine pelvic exam. You may have tests to find out more about the cyst, such as:  Ultrasound.  X-ray of the pelvis.  CT scan.  MRI.  Blood tests.  How is this treated? Many ovarian cysts go away on their own without treatment. Your health care provider may want to check your cyst regularly for 2-3 months to see if it changes. If you are in menopause, it is especially important to have your cyst monitored closely because menopausal women have a higher rate of ovarian cancer. When treatment is needed, it may include:  Medicines to help relieve pain.  A procedure to drain the cyst (aspiration).  Surgery to remove the whole cyst.  Hormone treatment or birth control pills. These methods are sometimes used to help dissolve a cyst.  Follow these instructions at home:  Take over-the-counter and prescription medicines only as told by your health care provider.  Do not drive or use heavy machinery while taking prescription pain medicine.  Get regular pelvic exams and Pap tests as often as told by your health care   provider.  Return to your normal activities as told by your health care provider. Ask your health care provider what activities are safe for you.  Do not use any products that contain nicotine or tobacco, such as cigarettes and e-cigarettes. If you need help quitting, ask your health care provider.  Keep all follow-up visits as told by your health care provider.  This is important. Contact a health care provider if:  Your periods are late, irregular, or painful, or they stop.  You have pelvic pain that does not go away.  You have pressure on your bladder or trouble emptying your bladder completely.  You have pain during sex.  You have any of the following in your abdomen: ? A feeling of fullness. ? Pressure. ? Discomfort. ? Pain that does not go away. ? Swelling.  You feel generally ill.  You become constipated.  You lose your appetite.  You develop severe acne.  You start to have more body hair and facial hair.  You are gaining weight or losing weight without changing your exercise and eating habits.  You think you may be pregnant. Get help right away if:  You have abdominal pain that is severe or gets worse.  You cannot eat or drink without vomiting.  You suddenly develop a fever.  Your menstrual period is much heavier than usual. This information is not intended to replace advice given to you by your health care provider. Make sure you discuss any questions you have with your health care provider. Document Released: 04/02/2005 Document Revised: 10/21/2015 Document Reviewed: 09/04/2015 Elsevier Interactive Patient Education  2018 Elsevier Inc.  

## 2017-04-26 NOTE — MAU Note (Signed)
Called CNM Clemmons regarding Ultrasound results.

## 2017-04-30 ENCOUNTER — Other Ambulatory Visit: Payer: Self-pay | Admitting: Obstetrics and Gynecology

## 2017-05-01 NOTE — Patient Instructions (Addendum)
Your procedure is scheduled on:  Friday, Feb 1  Enter through the [employer] of [facility] at: 7:30 am  Pick up the phone at the desk and dial [phone].  Call this number if you have problems the morning of surgery: [phone].  Remember: Do NOT eat or Do NOT drink clear liquids (including water) after midnight Thursday  Take these medicines the morning of surgery with a SIP OF WATER:  None   Do NOT wear jewelry (body piercing), metal hair clips/bobby pins, make-up, or nail polish. Do NOT wear lotions, powders, or perfumes.  You may wear deoderant. Do NOT shave for 48 hours prior to surgery. Do NOT bring valuables to the hospital.  Have a responsible adult drive you home and stay with you for 24 hours after your procedure.  Home with Boyfriend Sharon Browning cell [phone] or Mother Sharon Browning cell [phone].

## 2017-05-02 NOTE — H&P (Signed)
Sharon Browning is a 27 y.o. female  P: 2-0-1-2 who presents for an ovarian cystectomy because of pelvic pain and an enlarging  dermoid cyst.  During a prenatal ultrasound in 2017,  the patient was found to have an asymptomatic left ovarian dermoid cyst ( 6.2 cm).  She continued to be asymptomatic until  4 weeks following her vaginal delivery on March 27, 2017 when she appeared at [facility] ED and later the MAU at [facility] with pelvic pain accompanied by nausea and vomiting.  A pelvic ultrasound at that time showed.a retroflexed uterus measuring: 7.1 x 5.1 x  6.8 cm, endometrium: 3 mm, a left ovary-3.5 cm and a right ovary-3.1 cm with an 8.0 cm complex echogenic mass consistent with a dermoid cyst-at risk for torsion. [previoius scan indicated the cyst was on the left]  The patient's pain has been managed with Percocet until scheduling of surgery to remove the dermoid cyst.  Given the  characteristics of  along with the persistent and enlarging nature of her cyst,not  to mention discomfort,  the patient has consented to proceed with removal of her ovarian cyst.    Past Medical History  OB History: G: 3;   P: 2-0-1-2;   SVD 2015 and 2018  GYN History: menarche: 27 YO    LMP: 3/18 (delivered 03/27/2017)    Contracepton no method  The patient reports a past history of: chlamydia, herpes and HPV.  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 Sulfur Hives    ROS: Admits to pelvic pain  and occasional constipation but denies headache, vision  changes, nasal congestion, dysphagia, tinnitus, dizziness, hoarseness, cough,  chest pain, shortness of breath, nausea, vomiting, diarrhea, urinary frequency, urgency  dysuria, hematuria, vaginitis symptoms,  swelling of joints,easy bruising,  myalgias, arthralgias, skin rashes, unexplained weight loss and except as is mentioned in the history of present illness, patient's review of systems is otherwise negative.  Physical Exam  Bp:  114/70    Temperature: 98.5 degrees F orally    Weight: 211 lbs.  Height: 5\' 5"   BMI: 35.1  Neck: supple without masses or thyromegaly Lungs: clear to auscultation Heart: regular rate and rhythm Abdomen: soft, non-tender and no organomegaly Pelvic:EGBUS- wnl; vagina-normal rugae; uterus-normal size, cervix without lesions or motion tenderness; adnexae-right adnexal fullness, otherwise normal Extremities:  no clubbing, cyanosis or edema   Assesment: Large Ovarian Cyst (Dermoid)                       Pelvic Pain   Disposition:  A discussion was held with patient regarding the indication for her procedure(s) along with the risks, which include but are not limited to: reaction to anesthesia, damage to adjacent organs, infection  excessive bleeding and the possible need for an open abdominal incision.  The patient verbalized understanding  of these risks and has consented to proceed with Laparoscopic Ovarian Cystectomy with Possible Laparotomy on May 17, 2017.  CSN# [id number]   Irena Gaydos J. Florene Glen, PA-C  for Dr.Naima A. Dillard

## 2017-05-06 ENCOUNTER — Encounter (HOSPITAL_COMMUNITY): Payer: Self-pay

## 2017-05-06 ENCOUNTER — Other Ambulatory Visit: Payer: Self-pay

## 2017-05-06 ENCOUNTER — Encounter (HOSPITAL_COMMUNITY)
Admission: RE | Admit: 2017-05-06 | Discharge: 2017-05-06 | Disposition: A | Discharge status: Home / Self Care | Payer: 59 | Source: Ambulatory Visit | Attending: Obstetrics and Gynecology | Admitting: Obstetrics and Gynecology

## 2017-05-06 DIAGNOSIS — Z01812 Encounter for preprocedural laboratory examination: Secondary | ICD-10-CM | POA: Insufficient documentation

## 2017-05-06 LAB — CBC
HEMATOCRIT: 36.6 % (ref 36.0–46.0)
Hemoglobin: 11.9 g/dL — ABNORMAL LOW (ref 12.0–15.0)
MCH: 27.5 pg (ref 26.0–34.0)
MCHC: 32.5 g/dL (ref 30.0–36.0)
MCV: 84.7 fL (ref 78.0–100.0)
Platelets: 276 10*3/uL (ref 150–400)
RBC: 4.32 MIL/uL (ref 3.87–5.11)
RDW: 13.5 % (ref 11.5–15.5)
WBC: 5.5 10*3/uL (ref 4.0–10.5)

## 2017-05-17 ENCOUNTER — Ambulatory Visit (HOSPITAL_COMMUNITY)
Admission: AD | Admit: 2017-05-17 | Discharge: 2017-05-17 | Disposition: A | Discharge status: Home / Self Care | Payer: Medicaid Other | Source: Ambulatory Visit | Attending: Obstetrics and Gynecology | Admitting: Obstetrics and Gynecology

## 2017-05-17 ENCOUNTER — Encounter (HOSPITAL_COMMUNITY)
Admission: AD | Disposition: A | Discharge status: Home / Self Care | Payer: Self-pay | Source: Ambulatory Visit | Attending: Obstetrics and Gynecology

## 2017-05-17 ENCOUNTER — Ambulatory Visit (HOSPITAL_COMMUNITY): Payer: Medicaid Other | Admitting: Anesthesiology

## 2017-05-17 ENCOUNTER — Other Ambulatory Visit: Payer: Self-pay

## 2017-05-17 DIAGNOSIS — Z87891 Personal history of nicotine dependence: Secondary | ICD-10-CM | POA: Diagnosis not present

## 2017-05-17 DIAGNOSIS — D271 Benign neoplasm of left ovary: Secondary | ICD-10-CM | POA: Diagnosis not present

## 2017-05-17 HISTORY — PX: LAPAROSCOPIC OVARIAN CYSTECTOMY: SHX6248

## 2017-05-17 HISTORY — PX: LAPAROSCOPIC LYSIS OF ADHESIONS: SHX5905

## 2017-05-17 LAB — PREGNANCY, URINE: PREG TEST UR: NEGATIVE

## 2017-05-17 SURGERY — EXCISION, CYST, OVARY, LAPAROSCOPIC
Anesthesia: General | Laterality: Left

## 2017-05-17 MED ORDER — FENTANYL CITRATE (PF) 250 MCG/5ML IJ SOLN
INTRAMUSCULAR | Status: AC
  Filled 2017-05-17: qty 5

## 2017-05-17 MED ORDER — SCOPOLAMINE 1 MG/3DAYS TD PT72
1.0000 | MEDICATED_PATCH | Freq: Once | TRANSDERMAL | Status: DC
  Administered 2017-05-17: 1.5 mg via TRANSDERMAL

## 2017-05-17 MED ORDER — ONDANSETRON HCL 4 MG/2ML IJ SOLN
INTRAMUSCULAR | Status: DC | PRN
  Administered 2017-05-17: 4 mg via INTRAVENOUS

## 2017-05-17 MED ORDER — BUPIVACAINE-EPINEPHRINE 0.25% -1:200000 IJ SOLN
INTRAMUSCULAR | Status: DC | PRN
  Administered 2017-05-17: 15 mL

## 2017-05-17 MED ORDER — ONDANSETRON HCL 4 MG/2ML IJ SOLN
INTRAMUSCULAR | Status: AC
  Filled 2017-05-17: qty 2

## 2017-05-17 MED ORDER — OXYCODONE-ACETAMINOPHEN 5-325 MG PO TABS: 1.0000 | ORAL_TABLET | Freq: Four times a day (QID) | ORAL | 0 refills | Status: DC | PRN

## 2017-05-17 MED ORDER — DEXAMETHASONE SODIUM PHOSPHATE 10 MG/ML IJ SOLN
INTRAMUSCULAR | Status: AC
  Filled 2017-05-17: qty 1

## 2017-05-17 MED ORDER — GABAPENTIN 300 MG PO CAPS
ORAL_CAPSULE | ORAL | Status: AC
  Filled 2017-05-17: qty 1

## 2017-05-17 MED ORDER — ACETAMINOPHEN 500 MG PO TABS
1000.0000 mg | ORAL_TABLET | Freq: Once | ORAL | Status: AC
  Administered 2017-05-17: 1000 mg via ORAL

## 2017-05-17 MED ORDER — IBUPROFEN 600 MG PO TABS: ORAL_TABLET | ORAL | 1 refills | Status: DC

## 2017-05-17 MED ORDER — FENTANYL CITRATE (PF) 100 MCG/2ML IJ SOLN: 25.0000 ug | INTRAMUSCULAR | Status: DC | PRN

## 2017-05-17 MED ORDER — PROPOFOL 10 MG/ML IV BOLUS
INTRAVENOUS | Status: DC | PRN
  Administered 2017-05-17: 150 mg via INTRAVENOUS

## 2017-05-17 MED ORDER — LIDOCAINE HCL (CARDIAC) 20 MG/ML IV SOLN
INTRAVENOUS | Status: DC | PRN
  Administered 2017-05-17: 60 mg via INTRAVENOUS

## 2017-05-17 MED ORDER — LIDOCAINE HCL (CARDIAC) 20 MG/ML IV SOLN
INTRAVENOUS | Status: AC
  Filled 2017-05-17: qty 5

## 2017-05-17 MED ORDER — HYDROMORPHONE HCL 1 MG/ML IJ SOLN
INTRAMUSCULAR | Status: DC | PRN
  Administered 2017-05-17: 1 mg via INTRAVENOUS

## 2017-05-17 MED ORDER — LACTATED RINGERS IV SOLN
INTRAVENOUS | Status: DC
  Administered 2017-05-17: 11:00:00 via INTRAVENOUS
  Administered 2017-05-17: 125 mL/h via INTRAVENOUS

## 2017-05-17 MED ORDER — PROMETHAZINE HCL 25 MG/ML IJ SOLN
6.2500 mg | INTRAMUSCULAR | Status: DC | PRN
Start: 2017-05-17 — End: 2017-05-17

## 2017-05-17 MED ORDER — FENTANYL CITRATE (PF) 250 MCG/5ML IJ SOLN
INTRAMUSCULAR | Status: DC | PRN
  Administered 2017-05-17 (×2): 100 ug via INTRAVENOUS
  Administered 2017-05-17: 50 ug via INTRAVENOUS

## 2017-05-17 MED ORDER — DEXAMETHASONE SODIUM PHOSPHATE 10 MG/ML IJ SOLN
INTRAMUSCULAR | Status: DC | PRN
  Administered 2017-05-17: 10 mg via INTRAVENOUS

## 2017-05-17 MED ORDER — BUPIVACAINE-EPINEPHRINE (PF) 0.25% -1:200000 IJ SOLN
INTRAMUSCULAR | Status: AC
  Filled 2017-05-17: qty 30

## 2017-05-17 MED ORDER — MIDAZOLAM HCL 2 MG/2ML IJ SOLN
INTRAMUSCULAR | Status: AC
  Filled 2017-05-17: qty 2

## 2017-05-17 MED ORDER — PROPOFOL 10 MG/ML IV BOLUS
INTRAVENOUS | Status: AC
  Filled 2017-05-17: qty 20

## 2017-05-17 MED ORDER — SUGAMMADEX SODIUM 200 MG/2ML IV SOLN
INTRAVENOUS | Status: AC
  Filled 2017-05-17: qty 2

## 2017-05-17 MED ORDER — SUGAMMADEX SODIUM 200 MG/2ML IV SOLN
INTRAVENOUS | Status: DC | PRN
  Administered 2017-05-17: 200 mg via INTRAVENOUS

## 2017-05-17 MED ORDER — ROCURONIUM BROMIDE 100 MG/10ML IV SOLN
INTRAVENOUS | Status: DC | PRN
  Administered 2017-05-17: 50 mg via INTRAVENOUS

## 2017-05-17 MED ORDER — HYDROMORPHONE HCL 1 MG/ML IJ SOLN
INTRAMUSCULAR | Status: AC
  Filled 2017-05-17: qty 1

## 2017-05-17 MED ORDER — GABAPENTIN 300 MG PO CAPS
300.0000 mg | ORAL_CAPSULE | Freq: Once | ORAL | Status: AC
  Administered 2017-05-17: 300 mg via ORAL

## 2017-05-17 MED ORDER — MIDAZOLAM HCL 5 MG/5ML IJ SOLN
INTRAMUSCULAR | Status: DC | PRN
  Administered 2017-05-17: 2 mg via INTRAVENOUS

## 2017-05-17 MED ORDER — SCOPOLAMINE 1 MG/3DAYS TD PT72
MEDICATED_PATCH | TRANSDERMAL | Status: AC
  Filled 2017-05-17: qty 1

## 2017-05-17 MED ORDER — ACETAMINOPHEN 500 MG PO TABS
ORAL_TABLET | ORAL | Status: AC
  Filled 2017-05-17: qty 2

## 2017-05-17 MED ORDER — SODIUM CHLORIDE 0.9 % IR SOLN
Status: DC | PRN
  Administered 2017-05-17: 3000 mL

## 2017-05-17 SURGICAL SUPPLY — 36 items
BARRIER ADHS 3X4 INTERCEED (GAUZE/BANDAGES/DRESSINGS) IMPLANT
CABLE HIGH FREQUENCY MONO STRZ (ELECTRODE) IMPLANT
DERMABOND ADHESIVE PROPEN (GAUZE/BANDAGES/DRESSINGS) ×2
DERMABOND ADVANCED (GAUZE/BANDAGES/DRESSINGS) ×2
DERMABOND ADVANCED .7 DNX12 (GAUZE/BANDAGES/DRESSINGS) ×2 IMPLANT
DERMABOND ADVANCED .7 DNX6 (GAUZE/BANDAGES/DRESSINGS) ×2 IMPLANT
DRSG OPSITE POSTOP 3X4 (GAUZE/BANDAGES/DRESSINGS) ×4 IMPLANT
DRSG VASELINE 3X18 (GAUZE/BANDAGES/DRESSINGS) IMPLANT
DURAPREP 26ML APPLICATOR (WOUND CARE) ×4 IMPLANT
FORCEPS CUTTING 33CM 5MM (CUTTING FORCEPS) ×4 IMPLANT
FORCEPS CUTTING 45CM 5MM (CUTTING FORCEPS) IMPLANT
GLOVE BIO SURGEON STRL SZ 6.5 (GLOVE) ×3 IMPLANT
GLOVE BIO SURGEONS STRL SZ 6.5 (GLOVE) ×1
GLOVE BIOGEL PI IND STRL 7.0 (GLOVE) ×6 IMPLANT
GLOVE BIOGEL PI INDICATOR 7.0 (GLOVE) ×6
GOWN STRL REUS W/TWL LRG LVL3 (GOWN DISPOSABLE) ×8 IMPLANT
NS IRRIG 1000ML POUR BTL (IV SOLUTION) IMPLANT
PACK LAPAROSCOPY BASIN (CUSTOM PROCEDURE TRAY) ×4 IMPLANT
PACK TRENDGUARD 450 HYBRID PRO (MISCELLANEOUS) ×2 IMPLANT
POUCH LAPAROSCOPIC INSTRUMENT (MISCELLANEOUS) ×4 IMPLANT
POUCH SPECIMEN RETRIEVAL 10MM (ENDOMECHANICALS) ×4 IMPLANT
PROTECTOR NERVE ULNAR (MISCELLANEOUS) ×8 IMPLANT
SET IRRIG TUBING LAPAROSCOPIC (IRRIGATION / IRRIGATOR) ×4 IMPLANT
SHEARS HARMONIC ACE PLUS 36CM (ENDOMECHANICALS) IMPLANT
SLEEVE XCEL OPT CAN 5 100 (ENDOMECHANICALS) ×8 IMPLANT
SOLUTION ELECTROLUBE (MISCELLANEOUS) IMPLANT
SUT MNCRL AB 3-0 PS2 27 (SUTURE) ×4 IMPLANT
SUT VICRYL 0 ENDOLOOP (SUTURE) IMPLANT
SUT VICRYL 0 UR6 27IN ABS (SUTURE) ×4 IMPLANT
TOWEL OR 17X24 6PK STRL BLUE (TOWEL DISPOSABLE) ×8 IMPLANT
TRAY FOLEY CATH SILVER 14FR (SET/KITS/TRAYS/PACK) ×4 IMPLANT
TRENDGUARD 450 HYBRID PRO PACK (MISCELLANEOUS) ×4
TROCAR BALLN 12MMX100 BLUNT (TROCAR) ×4 IMPLANT
TROCAR XCEL NON-BLD 5MMX100MML (ENDOMECHANICALS) ×4 IMPLANT
TUBING INSUFFLATION 10FT LAP (TUBING) ×4 IMPLANT
WARMER LAPAROSCOPE (MISCELLANEOUS) ×4 IMPLANT

## 2017-05-17 NOTE — Anesthesia Procedure Notes (Signed)
Procedure Name: Intubation Date/Time: 05/17/2017 9:28 AM Performed by: Ignacia Bayley, CRNA Pre-anesthesia Checklist: Patient identified, Emergency Drugs available, Suction available and Patient being monitored Patient Re-evaluated:Patient Re-evaluated prior to induction Oxygen Delivery Method: Circle system utilized Preoxygenation: Pre-oxygenation with 100% oxygen Induction Type: IV induction Ventilation: Mask ventilation without difficulty Laryngoscope Size: Miller and 2 Grade View: Grade I Tube type: Oral Tube size: 7.0 mm Number of attempts: 1 Airway Equipment and Method: Stylet Placement Confirmation: ETT inserted through vocal cords under direct vision,  positive ETCO2 and breath sounds checked- equal and bilateral Secured at: 20 cm Tube secured with: Tape Dental Injury: Teeth and Oropharynx as per pre-operative assessment

## 2017-05-17 NOTE — Op Note (Signed)
Diagnostic Laparoscopy Procedure Note  Indications: The patient is a 27 y.o. female with left dermoid cyst.  Pre-operative Diagnosis: bilateral dermoid cysts  Post-operative Diagnosis: Left ovarian dermoid cyst.    Surgeon: SWFUXNA,TFTDD A   Assistants: E. Florene Glen PA  Anesthesia: General endotracheal anesthesia  ASA Class: per anesthesia  Procedure Details  The patient was seen in the Holding Room. The risks, benefits, complications, treatment options, and expected outcomes were discussed with the patient. The possibilities of reaction to medication, pulmonary aspiration, perforation of viscus, bleeding, recurrent infection, the need for additional procedures, failure to diagnose a condition, and creating a complication requiring transfusion or operation were discussed with the patient. The patient concurred with the proposed plan, giving informed consent. The patient was taken to the Operating Room, identified as Medical sales representative and the procedure verified as Diagnostic Laparoscopy. A Time Out was held and the above information confirmed.  After induction of general anesthesia, the patient was placed in modified dorsal lithotomy position where she was prepped, draped, and catheterized in the normal, sterile fashion.  The cervix was visualized and an intrauterine manipulator was placed. A 2 cm umbilical incision was then performed. The incision was extended to the fascia.  The fascia was incised and opened with the knife.  The peritoneum opened bluntly with a hemostat.   Circumferential suture was placed along the fascia.  The hassan was placed and anchored with the balloon.   The laparoscope was placed and a large cyst about 10 cm was adherent to the colon and anterior culdesac.  After LOA the  Cyst was torsed on a stalk from the left ovary.  The areas where LOA took place were hemostatic and no injury appeared to have occurred.  Using the gyrus I separated the the the ovarian cyst from the left  ovary.  I then drained the cyst as much as possible after making a hole with scissors.  The Nazhat was used.  The five scope was placed in the right lower quadrant.  The cyst was placed in an endoscopic bag and removed out of the umbilical port.  Because it was so large, I had to remove the hassan to remove the cyst.  It was removed contained and sent to pathology   The hasan was then replaced.  Irrigation was done.  Normal anatomy was noted.  Normal appearing liver, appendix, uterus, b tubes.  The left ovary now appeared normal .  The right ovary had a simple cyst.    The lower right trocars were removed under direct visualization.     Following the procedure the umbilical sheath was removed after intra-abdominal carbon dioxide was expressed. The incision was closed with subcutaneous and subcuticular sutures of 4-0 moniocryk. The intrauterine manipulator was then removed.  Instrument, sponge, and needle counts were correct prior to abdominal closure and at the conclusion of the case.   Findings: See above  Estimated Blood Loss:  Minimal         Drains: na         Total IV Fluids: 1253mL         Specimens: left dermoid cyst              Complications:  None; patient tolerated the procedure well.         Disposition: PACU - hemodynamically stable.         Condition: stable

## 2017-05-17 NOTE — Discharge Instructions (Signed)
Call [facility] @ [phone] if:  You have a temperature greater than or equal to 100.4 degrees Farenheit orally You have pain that is not made better by the pain medication given and taken as directed You have excessive bleeding or problems urinating  Take Colace (Docusate Sodium/Stool Softener) 100 mg 2-3 times daily while taking narcotic pain medicine to avoid constipation or until bowel movements are regular. Take Ibuprofen 600 mg with food every 6 hours for 5 days then as needed for pain  You may drive after 36 hour You may walk up steps  You may shower tomorrow  You may resume a regular diet  Keep incisions clean and dry; remove honeycomb dressing on 05/22/2017 Avoid anything in vagina  until after your post-operative visit   Post Anesthesia Home Care Instructions  Activity: Get plenty of rest for the remainder of the day. A responsible individual must stay with you for 24 hours following the procedure.  For the next 24 hours, DO NOT: -Drive a car -Paediatric nurse -Drink alcoholic beverages -Take any medication unless instructed by your physician -Make any legal decisions or sign important papers.  Meals: Start with liquid foods such as gelatin or soup. Progress to regular foods as tolerated. Avoid greasy, spicy, heavy foods. If nausea and/or vomiting occur, drink only clear liquids until the nausea and/or vomiting subsides. Call your physician if vomiting continues.  Special Instructions/Symptoms: Your throat may feel dry or sore from the anesthesia or the breathing tube placed in your throat during surgery. If this causes discomfort, gargle with warm salt water. The discomfort should disappear within 24 hours.  If you had a scopolamine patch placed behind your ear for the management of post- operative nausea and/or vomiting:  1. The medication in the patch is effective for 72 hours, after which it should be removed.  Wrap patch in a tissue and discard in  the trash. Wash hands thoroughly with soap and water. 2. You may remove the patch earlier than 72 hours if you experience unpleasant side effects which may include dry mouth, dizziness or visual disturbances. 3. Avoid touching the patch. Wash your hands with soap and water after contact with the patch.   DISCHARGE INSTRUCTIONS: Laparoscopy  The following instructions have been prepared to help you care for yourself upon your return home today.  Wound care:  Do not get the incision wet for the first 24 hours. The incision should be kept clean and dry.  The Band-Aids or dressings may be removed the day after surgery.  Should the incision become sore, red, and swollen after the first week, check with your doctor.  Personal hygiene:  Shower the day after your procedure.  Activity and limitations:  Do NOT drive or operate any equipment today.  Do NOT lift anything more than 15 pounds for 2-3 weeks after surgery.  Do NOT rest in bed all day.  Walking is encouraged. Walk each day, starting slowly with 5-minute walks 3 or 4 times a day. Slowly increase the length of your walks.  Walk up and down stairs slowly.  Do NOT do strenuous activities, such as golfing, playing tennis, bowling, running, biking, weight lifting, gardening, mowing, or vacuuming for 2-4 weeks. Ask your doctor when it is okay to start.  Diet: Eat a light meal as desired this evening. You may resume your usual diet tomorrow.  Return to work: This is dependent on the type of work you do. For the most part you can return to  a desk job within a week of surgery. If you are more active at work, please discuss this with your doctor.  What to expect after your surgery: You may have a slight burning sensation when you urinate on the first day. You may have a very small amount of blood in the urine. Expect to have a small amount of vaginal discharge/light bleeding for 1-2 weeks. It is not unusual to have abdominal soreness and  bruising for up to 2 weeks. You may be tired and need more rest for about 1 week. You may experience shoulder pain for 24-72 hours. Lying flat in bed may relieve it.  Call your doctor for any of the following:  Develop a fever of 100.4 or greater  Inability to urinate 6 hours after discharge from hospital  Severe pain not relieved by pain medications  Persistent of heavy bleeding at incision site  Redness or swelling around incision site after a week  Increasing nausea or vomiting

## 2017-05-17 NOTE — Anesthesia Preprocedure Evaluation (Addendum)
Anesthesia Evaluation  Patient identified by MRN, date of birth, ID band Patient awake    Reviewed: Allergy & Precautions, NPO status , Patient's Chart, lab work & pertinent test results  Airway Mallampati: II  TM Distance: >3 FB Neck ROM: Full    Dental  (+) Teeth Intact, Dental Advisory Given, Chipped,    Pulmonary former smoker,    Pulmonary exam normal breath sounds clear to auscultation       Cardiovascular Exercise Tolerance: Good negative cardio ROS Normal cardiovascular exam Rhythm:Regular Rate:Normal     Neuro/Psych negative neurological ROS  negative psych ROS   GI/Hepatic negative GI ROS, Neg liver ROS,   Endo/Other  negative endocrine ROS  Renal/GU negative Renal ROS     Musculoskeletal negative musculoskeletal ROS (+)   Abdominal   Peds  Hematology negative hematology ROS (+)   Anesthesia Other Findings Day of surgery medications reviewed with the patient.  Reproductive/Obstetrics Dermoid ovarian cyst                            Anesthesia Physical Anesthesia Plan  ASA: II  Anesthesia Plan: General   Post-op Pain Management:    Induction: Intravenous  PONV Risk Score and Plan: 4 or greater and Scopolamine patch - Pre-op, Midazolam, Dexamethasone and Ondansetron  Airway Management Planned: Oral ETT  Additional Equipment:   Intra-op Plan:   Post-operative Plan: Extubation in OR  Informed Consent: I have reviewed the patients History and Physical, chart, labs and discussed the procedure including the risks, benefits and alternatives for the proposed anesthesia with the patient or authorized representative who has indicated his/her understanding and acceptance.   Dental advisory given  Plan Discussed with:   Anesthesia Plan Comments:         Anesthesia Quick Evaluation

## 2017-05-17 NOTE — Transfer of Care (Signed)
Immediate Anesthesia Transfer of Care Note  Patient: Medical sales representative  Procedure(s) Performed: LAPAROSCOPIC LEFT OVARIAN DERMOID CYSTECTOMY (Left ) LAPAROSCOPIC LYSIS OF ADHESIONS  Patient Location: PACU  Anesthesia Type:General  Level of Consciousness: sedated  Airway & Oxygen Therapy: Patient Spontanous Breathing and Patient connected to nasal cannula oxygen  Post-op Assessment: Report given to RN and Post -op Vital signs reviewed and stable  Post vital signs: stable  Last Vitals:  Vitals:   05/17/17 0751  BP: 125/85  Pulse: 97  Resp: 16  Temp: 37.3 C  SpO2: 100%    Last Pain:  Vitals:   05/17/17 0751  TempSrc: Oral      Patients Stated Pain Goal: 3 (89/16/94 5038)  Complications: No apparent anesthesia complications

## 2017-05-17 NOTE — Anesthesia Postprocedure Evaluation (Signed)
Anesthesia Post Note  Patient: Medical sales representative  Procedure(s) Performed: LAPAROSCOPIC LEFT OVARIAN DERMOID CYSTECTOMY (Left ) LAPAROSCOPIC LYSIS OF ADHESIONS     Patient location during evaluation: PACU Anesthesia Type: General Level of consciousness: awake and alert Pain management: pain level controlled Vital Signs Assessment: post-procedure vital signs reviewed and stable Respiratory status: spontaneous breathing, nonlabored ventilation and respiratory function stable Cardiovascular status: blood pressure returned to baseline and stable Postop Assessment: no apparent nausea or vomiting Anesthetic complications: no    Last Vitals:  Vitals:   05/17/17 1130 05/17/17 1220  BP: 109/66 122/76  Pulse: 73 76  Resp: 16 18  Temp:    SpO2: 97% 98%    Last Pain:  Vitals:   05/17/17 1130  TempSrc:   PainSc: 0-No pain   Pain Goal: Patients Stated Pain Goal: 3 (05/17/17 0751)               Catalina Gravel

## 2017-05-18 ENCOUNTER — Encounter (HOSPITAL_COMMUNITY): Payer: Self-pay | Admitting: Obstetrics and Gynecology

## 2017-06-12 ENCOUNTER — Encounter (HOSPITAL_COMMUNITY): Payer: Self-pay | Admitting: *Deleted

## 2017-06-12 ENCOUNTER — Emergency Department (HOSPITAL_COMMUNITY): Payer: 59

## 2017-06-12 ENCOUNTER — Inpatient Hospital Stay (HOSPITAL_COMMUNITY): Payer: 59

## 2017-06-12 ENCOUNTER — Other Ambulatory Visit: Payer: Self-pay

## 2017-06-12 ENCOUNTER — Observation Stay (HOSPITAL_COMMUNITY)
Admission: EM | Admit: 2017-06-12 | Discharge: 2017-06-14 | Disposition: A | Discharge status: Home / Self Care | Payer: 59 | Attending: Surgery | Admitting: Surgery

## 2017-06-12 ENCOUNTER — Encounter (HOSPITAL_COMMUNITY): Payer: Self-pay | Admitting: Emergency Medicine

## 2017-06-12 ENCOUNTER — Inpatient Hospital Stay (HOSPITAL_COMMUNITY)
Admission: AD | Admit: 2017-06-12 | Discharge: 2017-06-12 | Discharge status: Against Medical Advice | Payer: 59 | Source: Ambulatory Visit | Attending: Obstetrics & Gynecology | Admitting: Obstetrics & Gynecology

## 2017-06-12 DIAGNOSIS — Z87891 Personal history of nicotine dependence: Secondary | ICD-10-CM | POA: Insufficient documentation

## 2017-06-12 DIAGNOSIS — Z825 Family history of asthma and other chronic lower respiratory diseases: Secondary | ICD-10-CM | POA: Insufficient documentation

## 2017-06-12 DIAGNOSIS — K8010 Calculus of gallbladder with chronic cholecystitis without obstruction: Principal | ICD-10-CM | POA: Insufficient documentation

## 2017-06-12 DIAGNOSIS — E669 Obesity, unspecified: Secondary | ICD-10-CM | POA: Insufficient documentation

## 2017-06-12 DIAGNOSIS — R945 Abnormal results of liver function studies: Secondary | ICD-10-CM

## 2017-06-12 DIAGNOSIS — Z3202 Encounter for pregnancy test, result negative: Secondary | ICD-10-CM | POA: Insufficient documentation

## 2017-06-12 DIAGNOSIS — Z8249 Family history of ischemic heart disease and other diseases of the circulatory system: Secondary | ICD-10-CM | POA: Insufficient documentation

## 2017-06-12 DIAGNOSIS — R1011 Right upper quadrant pain: Secondary | ICD-10-CM | POA: Diagnosis present

## 2017-06-12 DIAGNOSIS — R7989 Other specified abnormal findings of blood chemistry: Secondary | ICD-10-CM

## 2017-06-12 DIAGNOSIS — K8050 Calculus of bile duct without cholangitis or cholecystitis without obstruction: Secondary | ICD-10-CM | POA: Diagnosis present

## 2017-06-12 DIAGNOSIS — Z833 Family history of diabetes mellitus: Secondary | ICD-10-CM | POA: Insufficient documentation

## 2017-06-12 DIAGNOSIS — Z882 Allergy status to sulfonamides status: Secondary | ICD-10-CM | POA: Insufficient documentation

## 2017-06-12 DIAGNOSIS — Z823 Family history of stroke: Secondary | ICD-10-CM | POA: Diagnosis not present

## 2017-06-12 DIAGNOSIS — M549 Dorsalgia, unspecified: Secondary | ICD-10-CM | POA: Diagnosis present

## 2017-06-12 DIAGNOSIS — K8080 Other cholelithiasis without obstruction: Secondary | ICD-10-CM | POA: Diagnosis present

## 2017-06-12 DIAGNOSIS — K8020 Calculus of gallbladder without cholecystitis without obstruction: Secondary | ICD-10-CM | POA: Diagnosis not present

## 2017-06-12 DIAGNOSIS — N83209 Unspecified ovarian cyst, unspecified side: Secondary | ICD-10-CM | POA: Diagnosis not present

## 2017-06-12 DIAGNOSIS — R110 Nausea: Secondary | ICD-10-CM

## 2017-06-12 DIAGNOSIS — Z419 Encounter for procedure for purposes other than remedying health state, unspecified: Secondary | ICD-10-CM

## 2017-06-12 DIAGNOSIS — Z9889 Other specified postprocedural states: Secondary | ICD-10-CM | POA: Insufficient documentation

## 2017-06-12 DIAGNOSIS — Z803 Family history of malignant neoplasm of breast: Secondary | ICD-10-CM | POA: Insufficient documentation

## 2017-06-12 LAB — URINALYSIS, ROUTINE W REFLEX MICROSCOPIC
BILIRUBIN URINE: NEGATIVE
GLUCOSE, UA: NEGATIVE mg/dL
KETONES UR: NEGATIVE mg/dL
NITRITE: NEGATIVE
PH: 7 (ref 5.0–8.0)
Protein, ur: NEGATIVE mg/dL
Specific Gravity, Urine: 1.014 (ref 1.005–1.030)

## 2017-06-12 LAB — COMPREHENSIVE METABOLIC PANEL
ALK PHOS: 154 U/L — AB (ref 38–126)
ALT: 286 U/L — ABNORMAL HIGH (ref 14–54)
ANION GAP: 9 (ref 5–15)
AST: 369 U/L — ABNORMAL HIGH (ref 15–41)
Albumin: 4 g/dL (ref 3.5–5.0)
BILIRUBIN TOTAL: 1.3 mg/dL — AB (ref 0.3–1.2)
BUN: 11 mg/dL (ref 6–20)
CALCIUM: 8.9 mg/dL (ref 8.9–10.3)
CO2: 21 mmol/L — ABNORMAL LOW (ref 22–32)
Chloride: 107 mmol/L (ref 101–111)
Creatinine, Ser: 0.97 mg/dL (ref 0.44–1.00)
GFR calc non Af Amer: >60 mL/min (ref >60)
Glucose, Bld: 96 mg/dL (ref 65–99)
Potassium: 4.3 mmol/L (ref 3.5–5.1)
SODIUM: 137 mmol/L (ref 135–145)
Total Protein: 7.8 g/dL (ref 6.5–8.1)

## 2017-06-12 LAB — CBC WITH DIFFERENTIAL/PLATELET
Basophils Absolute: 0 10*3/uL (ref 0.0–0.1)
Basophils Relative: 0 %
EOS ABS: 0 10*3/uL (ref 0.0–0.7)
Eosinophils Relative: 0 %
HCT: 39.4 % (ref 36.0–46.0)
HEMOGLOBIN: 13 g/dL (ref 12.0–15.0)
LYMPHS ABS: 2 10*3/uL (ref 0.7–4.0)
Lymphocytes Relative: 34 %
MCH: 27.5 pg (ref 26.0–34.0)
MCHC: 33 g/dL (ref 30.0–36.0)
MCV: 83.3 fL (ref 78.0–100.0)
MONO ABS: 0.3 10*3/uL (ref 0.1–1.0)
MONOS PCT: 5 %
NEUTROS PCT: 61 %
Neutro Abs: 3.4 10*3/uL (ref 1.7–7.7)
Platelets: 220 10*3/uL (ref 150–400)
RBC: 4.73 MIL/uL (ref 3.87–5.11)
RDW: 13.7 % (ref 11.5–15.5)
WBC: 5.8 10*3/uL (ref 4.0–10.5)

## 2017-06-12 LAB — LIPASE, BLOOD: Lipase: 47 U/L (ref 11–51)

## 2017-06-12 LAB — POCT PREGNANCY, URINE: Preg Test, Ur: NEGATIVE

## 2017-06-12 MED ORDER — ONDANSETRON HCL 4 MG/2ML IJ SOLN
4.0000 mg | Freq: Four times a day (QID) | INTRAMUSCULAR | Status: DC | PRN
Start: 2017-06-12 — End: 2017-06-14

## 2017-06-12 MED ORDER — ENOXAPARIN SODIUM 40 MG/0.4ML ~~LOC~~ SOLN
40.0000 mg | Freq: Every day | SUBCUTANEOUS | Status: DC
  Administered 2017-06-12 – 2017-06-13 (×2): 40 mg via SUBCUTANEOUS
  Filled 2017-06-12 (×2): qty 0.4

## 2017-06-12 MED ORDER — SIMETHICONE 80 MG PO CHEW: 40.0000 mg | CHEWABLE_TABLET | Freq: Four times a day (QID) | ORAL | Status: DC | PRN

## 2017-06-12 MED ORDER — SODIUM CHLORIDE 0.9 % IV SOLN
2.0000 g | Freq: Every day | INTRAVENOUS | Status: DC
  Administered 2017-06-12 – 2017-06-13 (×2): 2 g via INTRAVENOUS
  Filled 2017-06-12: qty 2
  Filled 2017-06-12: qty 20

## 2017-06-12 MED ORDER — KETOROLAC TROMETHAMINE 60 MG/2ML IM SOLN
60.0000 mg | Freq: Once | INTRAMUSCULAR | Status: AC
  Administered 2017-06-12: 60 mg via INTRAMUSCULAR
  Filled 2017-06-12: qty 2

## 2017-06-12 MED ORDER — GI COCKTAIL ~~LOC~~
30.0000 mL | Freq: Once | ORAL | Status: AC
  Administered 2017-06-12: 30 mL via ORAL
  Filled 2017-06-12: qty 30

## 2017-06-12 MED ORDER — SODIUM CHLORIDE 0.9 % IJ SOLN
INTRAMUSCULAR | Status: AC
  Filled 2017-06-12: qty 50

## 2017-06-12 MED ORDER — MORPHINE SULFATE (PF) 2 MG/ML IV SOLN: 2.0000 mg | INTRAVENOUS | Status: DC | PRN

## 2017-06-12 MED ORDER — IOPAMIDOL (ISOVUE-300) INJECTION 61%
INTRAVENOUS | Status: AC
Start: 2017-06-12 — End: 2017-06-12
  Administered 2017-06-12: 100 mL
  Filled 2017-06-12: qty 100

## 2017-06-12 MED ORDER — KCL IN DEXTROSE-NACL 20-5-0.45 MEQ/L-%-% IV SOLN
INTRAVENOUS | Status: DC
  Administered 2017-06-12 – 2017-06-13 (×2): via INTRAVENOUS
  Filled 2017-06-12 (×3): qty 1000

## 2017-06-12 MED ORDER — ONDANSETRON 4 MG PO TBDP: 4.0000 mg | ORAL_TABLET | Freq: Four times a day (QID) | ORAL | Status: DC | PRN

## 2017-06-12 MED ORDER — DOCUSATE SODIUM 100 MG PO CAPS
100.0000 mg | ORAL_CAPSULE | Freq: Two times a day (BID) | ORAL | Status: DC
  Administered 2017-06-12 – 2017-06-14 (×3): 100 mg via ORAL
  Filled 2017-06-12 (×3): qty 1

## 2017-06-12 NOTE — MAU Note (Signed)
Pt had to leave AMA to pick up her kids. We were waiting for general surgery to call back. Advised pt they may want to do surgery today or tomorrow and told pt if she does have to leave that she may have to go back to [facility] this evening or tomorrow. Pt was advised not to eat for the time being until she hears from [place]. Pt's phone number is [phone].

## 2017-06-12 NOTE — H&P (Addendum)
Sharon Browning is an 27 y.o. female.   Chief Complaint: Mid epigastric pain HPI: 27 yo F recently post partum with 3 days of mid-epigastric pain radiating to her back.  Pain is colicky in nature and not positional.  She does not know if it is worse after eating.  She had similar pains during pregnancy associated with nausea.  She is 4 wks s/p lap ovarian cystectomy.  No fevers or changes in bowel habits.  Past Medical History:  Diagnosis Date  . Ovarian cyst    dermoid  . SVD (spontaneous vaginal delivery) 2015, 2018   x 2 - SVD 03/27/17 epidural-no problems    Past Surgical History:  Procedure Laterality Date  . LAPAROSCOPIC LYSIS OF ADHESIONS  05/17/2017   Procedure: LAPAROSCOPIC LYSIS OF ADHESIONS;  Surgeon: Crawford Givens, MD;  Location: [facility];  Service: Gynecology;;  . LAPAROSCOPIC OVARIAN CYSTECTOMY Left 05/17/2017   Procedure: LAPAROSCOPIC LEFT OVARIAN DERMOID CYSTECTOMY;  Surgeon: Crawford Givens, MD;  Location: [facility];  Service: Gynecology;  Laterality: Left;    Family History  Problem Relation Age of Onset  . Asthma Brother   . Diabetes Maternal Grandmother   . Hypertension Maternal Grandmother   . Stroke Maternal Grandmother   . Cancer Paternal Grandmother        breast   Social History:  reports that she has quit smoking. Her smoking use included cigarettes. she has never used smokeless tobacco. She reports that she does not drink alcohol or use drugs.  Allergies:  Allergies  Allergen Reactions  . Sulfa Antibiotics Hives     (Not in a hospital admission)  Results for orders placed or performed during the hospital encounter of 06/12/17 (from the past 48 hour(s))  Urinalysis, Routine w reflex microscopic     Status: Abnormal   Collection Time: 06/12/17 10:25 AM  Result Value Ref Range   Color, Urine YELLOW YELLOW   APPearance HAZY (A) CLEAR   Specific Gravity, Urine 1.014 1.005 - 1.030   pH 7.0 5.0 - 8.0   Glucose, UA NEGATIVE NEGATIVE mg/dL   Hgb urine  dipstick SMALL (A) NEGATIVE   Bilirubin Urine NEGATIVE NEGATIVE   Ketones, ur NEGATIVE NEGATIVE mg/dL   Protein, ur NEGATIVE NEGATIVE mg/dL   Nitrite NEGATIVE NEGATIVE   Leukocytes, UA LARGE (A) NEGATIVE   RBC / HPF 0-5 0 - 5 RBC/hpf   WBC, UA 6-30 0 - 5 WBC/hpf   Bacteria, UA RARE (A) NONE SEEN   Squamous Epithelial / LPF 6-30 (A) NONE SEEN    Comment: Performed at [facility], [address] [place], [place] [address]  Pregnancy, urine POC     Status: None   Collection Time: 06/12/17 10:50 AM  Result Value Ref Range   Preg Test, Ur NEGATIVE NEGATIVE    Comment:        THE SENSITIVITY OF THIS METHODOLOGY IS >24 mIU/mL   CBC with Differential     Status: None   Collection Time: 06/12/17  2:07 PM  Result Value Ref Range   WBC 5.8 4.0 - 10.5 K/uL   RBC 4.73 3.87 - 5.11 MIL/uL   Hemoglobin 13.0 12.0 - 15.0 g/dL   HCT 39.4 36.0 - 46.0 %   MCV 83.3 78.0 - 100.0 fL   MCH 27.5 26.0 - 34.0 pg   MCHC 33.0 30.0 - 36.0 g/dL   RDW 13.7 11.5 - 15.5 %   Platelets 220 150 - 400 K/uL   Neutrophils Relative % 61 %  Neutro Abs 3.4 1.7 - 7.7 K/uL   Lymphocytes Relative 34 %   Lymphs Abs 2.0 0.7 - 4.0 K/uL   Monocytes Relative 5 %   Monocytes Absolute 0.3 0.1 - 1.0 K/uL   Eosinophils Relative 0 %   Eosinophils Absolute 0.0 0.0 - 0.7 K/uL   Basophils Relative 0 %   Basophils Absolute 0.0 0.0 - 0.1 K/uL    Comment: Performed at [facility], [address], [place], [place] [address]  Comprehensive metabolic panel     Status: Abnormal   Collection Time: 06/12/17  2:07 PM  Result Value Ref Range   Sodium 137 135 - 145 mmol/L   Potassium 4.3 3.5 - 5.1 mmol/L   Chloride 107 101 - 111 mmol/L   CO2 21 (L) 22 - 32 mmol/L   Glucose, Bld 96 65 - 99 mg/dL   BUN 11 6 - 20 mg/dL   Creatinine, Ser 0.97 0.44 - 1.00 mg/dL   Calcium 8.9 8.9 - 10.3 mg/dL   Total Protein 7.8 6.5 - 8.1 g/dL   Albumin 4.0 3.5 - 5.0 g/dL   AST 369 (H) 15 - 41 U/L   ALT 286 (H) 14 - 54 U/L   Alkaline  Phosphatase 154 (H) 38 - 126 U/L   Total Bilirubin 1.3 (H) 0.3 - 1.2 mg/dL   GFR calc non Af Amer >60 >60 mL/min   GFR calc Af Amer >60 >60 mL/min    Comment: (NOTE) The eGFR has been calculated using the CKD EPI equation. This calculation has not been validated in all clinical situations. eGFR's persistently <60 mL/min signify possible Chronic Kidney Disease.    Anion gap 9 5 - 15    Comment: Performed at [facility], [address] [place], [place] [address]  Lipase, blood     Status: None   Collection Time: 06/12/17  2:07 PM  Result Value Ref Range   Lipase 47 11 - 51 U/L    Comment: Performed at [facility], [address] [place], [place] [address]   Ct Abdomen Pelvis W Contrast  Result Date: 06/12/2017 CLINICAL DATA:  Abdominal pain.  Known gallstones EXAM: CT ABDOMEN AND PELVIS WITH CONTRAST TECHNIQUE: Multidetector CT imaging of the abdomen and pelvis was performed using the standard protocol following bolus administration of intravenous contrast. CONTRAST:  133m ISOVUE-300 IOPAMIDOL (ISOVUE-300) INJECTION 61% COMPARISON:  Ultrasound 06/12/2017 FINDINGS: Lower chest: Lung bases are clear. No effusions. Heart is normal size. Hepatobiliary: Small gallstones within the gallbladder. No focal hepatic abnormality. No biliary ductal dilatation. Pancreas: No focal abnormality or ductal dilatation. Spleen: No focal abnormality.  Normal size. Adrenals/Urinary Tract: No adrenal abnormality. No focal renal abnormality. No stones or hydronephrosis. Urinary bladder is unremarkable. Stomach/Bowel: Stomach, large and small bowel grossly unremarkable. Normal appendix. Vascular/Lymphatic: No evidence of aneurysm or adenopathy. Reproductive: Uterus and adnexa unremarkable.  No mass. Other: No free fluid or free air. Musculoskeletal: No acute bony abnormality. IMPRESSION: A few small layering gallstones in the gallbladder. No evidence of biliary duct dilatation or CT signs of acute  cholecystitis. Normal appendix. No acute findings in the abdomen or pelvis. Electronically Signed   By: KRolm BaptiseM.D.   On: 06/12/2017 20:02   UKoreaAbdomen Limited Ruq  Result Date: 06/12/2017 CLINICAL DATA:  Acute right upper quadrant abdominal pain. Mid back and upper abdominal pain for the past 3 days. EXAM: ULTRASOUND ABDOMEN LIMITED RIGHT UPPER QUADRANT COMPARISON:  None. FINDINGS: Gallbladder: Several small, mobile gallstones in the gallbladder measuring  up to 8 mm in maximum diameter each. No gallbladder wall thickening or pericholecystic fluid. No sonographic Murphy sign. Common bile duct: Diameter: 4.6 mm Liver: No focal lesion identified. Within normal limits in parenchymal echogenicity. Portal vein is patent on color Doppler imaging with normal direction of blood flow towards the liver. IMPRESSION: 1. Cholelithiasis without evidence of cholecystitis. 2. Otherwise, normal examination. Electronically Signed   By: Claudie Revering M.D.   On: 06/12/2017 14:39    Review of Systems  Constitutional: Negative for chills and fever.  HENT: Negative for hearing loss.   Eyes: Negative for blurred vision.  Cardiovascular: Positive for chest pain. Negative for palpitations.  Gastrointestinal: Positive for abdominal pain. Negative for nausea and vomiting.  Genitourinary: Negative for dysuria, frequency and urgency.  Musculoskeletal: Negative for myalgias and neck pain.  Skin: Negative for itching and rash.  Neurological: Negative for dizziness and headaches.    Blood pressure 132/87, pulse 78, temperature 98 F (36.7 C), temperature source Oral, resp. rate 16, SpO2 100 %, not currently breastfeeding. Physical Exam  Constitutional: She is oriented to person, place, and time. She appears well-developed and well-nourished. No distress.  HENT:  Head: Normocephalic.  Eyes: Conjunctivae and EOM are normal. Pupils are equal, round, and reactive to light.  Neck: Normal range of motion. Neck supple.   Cardiovascular: Normal rate and regular rhythm.  Respiratory: Effort normal. No respiratory distress.  GI: Soft. There is tenderness (mid epigastric). There is no rebound and no guarding.  Musculoskeletal: Normal range of motion.  Neurological: She is alert and oriented to person, place, and time.     Assessment/Plan 27 y.o. F with biliary colic and mildly elevated LFT's.  Will admit for pain control and possible lap chole in AM.  Recheck labs in AM.  Clears, NPO p MN.    Rosario Adie., MD 0/25/4270, 9:47 PM

## 2017-06-12 NOTE — MAU Provider Note (Signed)
History     CSN: [id number]  Arrival date and time: 06/12/17 4010   First Provider Initiated Contact with Patient 06/12/17 1342      Chief Complaint  Patient presents with  . Abdominal Pain  . Back Pain   HPI    Sharon Browning is a 27 y.o. female [id number] here in MAU with upper abdominal pain and upper back pain.  Says the pain at times starts in her upper back and at times in her upper right quadrant. It hurts to bend down and pick things up. The pain started Sunday night. She has never had this pain in the past. Says she took Percocet for the pain. Says she had these left over from her surgery. This helped the pain. She tried ibuprofen; no relief.  No changes in diet. The pain is not consistently worse after eating. States she ate biscuitvill  this morning and the pain worsened 1 hour after the biscuit.   OB History    Gravida Para Term Preterm AB Living   3 2 2   1 2    SAB TAB Ectopic Multiple Live Births   1     0 2      Past Medical History:  Diagnosis Date  . Ovarian cyst    dermoid  . SVD (spontaneous vaginal delivery) 2015, 2018   x 2 - SVD 03/27/17 epidural-no problems    Past Surgical History:  Procedure Laterality Date  . LAPAROSCOPIC LYSIS OF ADHESIONS  05/17/2017   Procedure: LAPAROSCOPIC LYSIS OF ADHESIONS;  Surgeon: Crawford Givens, MD;  Location: [facility];  Service: Gynecology;;  . LAPAROSCOPIC OVARIAN CYSTECTOMY Left 05/17/2017   Procedure: LAPAROSCOPIC LEFT OVARIAN DERMOID CYSTECTOMY;  Surgeon: Crawford Givens, MD;  Location: [facility];  Service: Gynecology;  Laterality: Left;    Family History  Problem Relation Age of Onset  . Asthma Brother   . Diabetes Maternal Grandmother   . Hypertension Maternal Grandmother   . Stroke Maternal Grandmother   . Cancer Paternal Grandmother        breast    Social History   Tobacco Use  . Smoking status: Former Smoker    Types: Cigarettes  . Smokeless tobacco: Never Used  . Tobacco comment: Quit mid 2014   Substance Use Topics  . Alcohol use: No  . Drug use: No    Allergies:  Allergies  Allergen Reactions  . Sulfa Antibiotics Hives    Medications Prior to Admission  Medication Sig Dispense Refill Last Dose  . ibuprofen (ADVIL,MOTRIN) 800 MG tablet Take 800 mg by mouth every 8 (eight) hours as needed for moderate pain.   06/12/2017 at Unknown time  . oxyCODONE-acetaminophen (PERCOCET/ROXICET) 5-325 MG tablet Take 1 tablet by mouth every 6 (six) hours as needed for severe pain. 20 tablet 0 06/11/2017 at Unknown time   Results for orders placed or performed during the hospital encounter of 06/12/17 (from the past 48 hour(s))  Urinalysis, Routine w reflex microscopic     Status: Abnormal   Collection Time: 06/12/17 10:25 AM  Result Value Ref Range   Color, Urine YELLOW YELLOW   APPearance HAZY (A) CLEAR   Specific Gravity, Urine 1.014 1.005 - 1.030   pH 7.0 5.0 - 8.0   Glucose, UA NEGATIVE NEGATIVE mg/dL   Hgb urine dipstick SMALL (A) NEGATIVE   Bilirubin Urine NEGATIVE NEGATIVE   Ketones, ur NEGATIVE NEGATIVE mg/dL   Protein, ur NEGATIVE NEGATIVE mg/dL   Nitrite NEGATIVE NEGATIVE   Leukocytes, UA LARGE (  A) NEGATIVE   RBC / HPF 0-5 0 - 5 RBC/hpf   WBC, UA 6-30 0 - 5 WBC/hpf   Bacteria, UA RARE (A) NONE SEEN   Squamous Epithelial / LPF 6-30 (A) NONE SEEN    Comment: Performed at [facility], [address], [place], [place] [address]  Pregnancy, urine POC     Status: None   Collection Time: 06/12/17 10:50 AM  Result Value Ref Range   Preg Test, Ur NEGATIVE NEGATIVE    Comment:        THE SENSITIVITY OF THIS METHODOLOGY IS >24 mIU/mL   CBC with Differential     Status: None   Collection Time: 06/12/17  2:07 PM  Result Value Ref Range   WBC 5.8 4.0 - 10.5 K/uL   RBC 4.73 3.87 - 5.11 MIL/uL   Hemoglobin 13.0 12.0 - 15.0 g/dL   HCT 39.4 36.0 - 46.0 %   MCV 83.3 78.0 - 100.0 fL   MCH 27.5 26.0 - 34.0 pg   MCHC 33.0 30.0 - 36.0 g/dL   RDW 13.7 11.5 - 15.5 %   Platelets  220 150 - 400 K/uL   Neutrophils Relative % 61 %   Neutro Abs 3.4 1.7 - 7.7 K/uL   Lymphocytes Relative 34 %   Lymphs Abs 2.0 0.7 - 4.0 K/uL   Monocytes Relative 5 %   Monocytes Absolute 0.3 0.1 - 1.0 K/uL   Eosinophils Relative 0 %   Eosinophils Absolute 0.0 0.0 - 0.7 K/uL   Basophils Relative 0 %   Basophils Absolute 0.0 0.0 - 0.1 K/uL    Comment: Performed at [facility], [address] [place], [place] [address]  Comprehensive metabolic panel     Status: Abnormal   Collection Time: 06/12/17  2:07 PM  Result Value Ref Range   Sodium 137 135 - 145 mmol/L   Potassium 4.3 3.5 - 5.1 mmol/L   Chloride 107 101 - 111 mmol/L   CO2 21 (L) 22 - 32 mmol/L   Glucose, Bld 96 65 - 99 mg/dL   BUN 11 6 - 20 mg/dL   Creatinine, Ser 0.97 0.44 - 1.00 mg/dL   Calcium 8.9 8.9 - 10.3 mg/dL   Total Protein 7.8 6.5 - 8.1 g/dL   Albumin 4.0 3.5 - 5.0 g/dL   AST 369 (H) 15 - 41 U/L   ALT 286 (H) 14 - 54 U/L   Alkaline Phosphatase 154 (H) 38 - 126 U/L   Total Bilirubin 1.3 (H) 0.3 - 1.2 mg/dL   GFR calc non Af Amer >60 >60 mL/min   GFR calc Af Amer >60 >60 mL/min    Comment: (NOTE) The eGFR has been calculated using the CKD EPI equation. This calculation has not been validated in all clinical situations. eGFR's persistently <60 mL/min signify possible Chronic Kidney Disease.    Anion gap 9 5 - 15    Comment: Performed at [facility], [address], [place], [place] [address]  Lipase, blood     Status: None   Collection Time: 06/12/17  2:07 PM  Result Value Ref Range   Lipase 47 11 - 51 U/L    Comment: Performed at [facility], [address], [place], [place] [address]    US Abdomen Limited Ruq  Result Date: 06/12/2017 CLINICAL DATA:  Acute right upper quadrant abdominal pain. Mid back and upper abdominal pain for the past 3 days. EXAM: ULTRASOUND ABDOMEN LIMITED RIGHT UPPER QUADRANT COMPARISON:  None. FINDINGS: Gallbladder:  Several small, mobile gallstones in the gallbladder  measuring up to 8 mm in maximum diameter each. No gallbladder wall thickening or pericholecystic fluid. No sonographic Murphy sign. Common bile duct: Diameter: 4.6 mm Liver: No focal lesion identified. Within normal limits in parenchymal echogenicity. Portal vein is patent on color Doppler imaging with normal direction of blood flow towards the liver. IMPRESSION: 1. Cholelithiasis without evidence of cholecystitis. 2. Otherwise, normal examination. Electronically Signed   By: Claudie Revering M.D.   On: 06/12/2017 14:39    Review of Systems  Constitutional: Negative for fever.  Gastrointestinal: Positive for abdominal pain and nausea. Negative for vomiting.   Physical Exam   Blood pressure 125/83, pulse 70, temperature 98.3 F (36.8 C), temperature source Oral, resp. rate 16, height 5' 4"  (1.626 m), weight 211 lb (95.7 kg), SpO2 100 %, not currently breastfeeding.  Physical Exam  Constitutional: She is oriented to person, place, and time. She appears well-developed and well-nourished. No distress.  HENT:  Head: Normocephalic.  Eyes: Pupils are equal, round, and reactive to light.  Respiratory: Effort normal.  GI: Normal appearance. There is no hepatosplenomegaly, splenomegaly or hepatomegaly. There is tenderness in the right upper quadrant. There is positive Murphy's sign. There is no CVA tenderness.  Patient had difficulty tolerating abdominal exam due to pain.   Musculoskeletal: Normal range of motion.  Neurological: She is alert and oriented to person, place, and time.  Skin: Skin is warm. She is not diaphoretic.  Psychiatric: Her behavior is normal.    MAU Course  Procedures  None  MDM  Toradol given IM Patient declined percocet   CBC, CMP, Lipase [place] RUQ Discussed [place] with Dr.Pinn, Will consult with general surgery  Spoke to [facility] with general surgery. Brook would like to consult with her attending to discuss labs and [place]. Mr. Pernell Dupre PA,  with general surgery who would like  to come to MAU to evaluate for admission vs. Outpatient management. Patient having a childcare dilemma and states that she needs to leave to go pick up her kids at school. She has no one that can pick them up. Patient discussed the risks of leaving prior to the surgical team including worsening pain and potentially a life threatening condition.  Patient signed out AMA, discussed with Mr. Pernell Dupre, [place] and made him aware of patient leaving. He recommends we call patient back and ask her to present to [facility] ED once she has her child care figured out.   Assessment and Plan   A:  1. Calculus of gallbladder without cholecystitis without obstruction   2. Acute abdominal pain in right upper quadrant   3. Nausea     P:  Patient left AMA to pick up her children.  Spoke to patient over the phone around 1730. Patient plans to go to [facility] ED for further evaluation.    Lezlie Lye, NP. 06/12/2017 5:46 PM

## 2017-06-12 NOTE — MAU Note (Signed)
Pt reports really bad mid back pain and upper abd pain x 3 days. States the pain makes her feel nauseated.

## 2017-06-12 NOTE — ED Provider Notes (Signed)
[facility] DEPT Provider Note   CSN: [id number] Arrival date & time: 06/12/17  1834     History   Chief Complaint Chief Complaint  Patient presents with  . Cholelithiasis    HPI Sharon Browning is a 27 y.o. female.  Epigastric pain with radiation to the back since Sunday.  Patient was evaluated at [facility] today.  Ultrasound of the gallbladder revealed cholelithiasis with multiple small stones, the largest being 8 mm in diameter.  Additionally she had a pan elevation of her liver functions (AST, ALT, total bilirubin, alkaline phosphatase).  General surgery was consulted, but patient left the hospital prior to an official opinion.  No fever, sweats, chills.  Severity of pain is moderate.      Past Medical History:  Diagnosis Date  . Ovarian cyst    dermoid  . SVD (spontaneous vaginal delivery) 2015, 2018   x 2 - SVD 03/27/17 epidural-no problems    Patient Active Problem List   Diagnosis Date Noted  . SVD (spontaneous vaginal delivery) 03/29/2017  . Indication for care or intervention related to labor and delivery 03/27/2017  . Uterine contractions during pregnancy 03/27/2017  . Rh negative state in antepartum period 06/22/2015  . 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 Obesity 04/10/2014  . 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 LAPAROSCOPIC LYSIS OF ADHESIONS  05/17/2017   Procedure: LAPAROSCOPIC LYSIS OF ADHESIONS;  Surgeon: Crawford Givens, MD;  Location: [facility];  Service: Gynecology;;  . LAPAROSCOPIC OVARIAN CYSTECTOMY Left 05/17/2017   Procedure: LAPAROSCOPIC LEFT OVARIAN DERMOID CYSTECTOMY;  Surgeon: Crawford Givens, MD;  Location: [facility];  Service: Gynecology;  Laterality: Left;    OB History    Gravida Para Term Preterm AB Living   3 2 2   1 2    SAB TAB Ectopic Multiple Live Births   1      0 2       Home Medications    Prior to Admission medications   Medication Sig Start Date End Date Taking? Authorizing Provider  ibuprofen (ADVIL,MOTRIN) 800 MG tablet Take 800 mg by mouth every 8 (eight) hours as needed for moderate pain.   Yes [provider]  oxyCODONE-acetaminophen (PERCOCET/ROXICET) 5-325 MG tablet Take 1 tablet by mouth every 6 (six) hours as needed for severe pain. 05/17/17  Yes Earnstine Regal, PA-C    Family History Family History  Problem Relation Age of Onset  . Asthma Brother   . Diabetes Maternal Grandmother   . Hypertension Maternal Grandmother   . Stroke Maternal Grandmother   . Cancer Paternal Grandmother        breast    Social History Social History   Tobacco Use  . Smoking status: Former Smoker    Types: Cigarettes  . Smokeless tobacco: Never Used  . Tobacco comment: Quit mid 2014  Substance Use Topics  . Alcohol use: No  . Drug use: No     Allergies   Sulfa antibiotics   Review of Systems Review of Systems  All other systems reviewed and are negative.    Physical Exam Updated Vital Signs BP 132/87 (BP Location: Left Arm)   Pulse 78   Temp 98 F (36.7 C) (Oral)   Resp 16   SpO2 100%   Physical Exam  Constitutional: She is oriented to person, place, and time. She appears well-developed  and well-nourished.  HENT:  Head: Normocephalic and atraumatic.  Eyes: Conjunctivae are normal.  Neck: Neck supple.  Cardiovascular: Normal rate and regular rhythm.  Pulmonary/Chest: Effort normal and breath sounds normal.  Abdominal: Bowel sounds are normal.  Minimal epigastric and right upper quadrant tenderness  Musculoskeletal: Normal range of motion.  Neurological: She is alert and oriented to person, place, and time.  Skin: Skin is warm and dry.  Psychiatric: She has a normal mood and affect. Her behavior is normal.  Nursing note and vitals reviewed.    ED Treatments / Results  Labs (all labs ordered are listed,  but only abnormal results are displayed) Labs Reviewed - No data to display  EKG  EKG Interpretation None       Radiology Ct Abdomen Pelvis W Contrast  Result Date: 06/12/2017 CLINICAL DATA:  Abdominal pain.  Known gallstones EXAM: CT ABDOMEN AND PELVIS WITH CONTRAST TECHNIQUE: Multidetector CT imaging of the abdomen and pelvis was performed using the standard protocol following bolus administration of intravenous contrast. CONTRAST:  113mL ISOVUE-300 IOPAMIDOL (ISOVUE-300) INJECTION 61% COMPARISON:  Ultrasound 06/12/2017 FINDINGS: Lower chest: Lung bases are clear. No effusions. Heart is normal size. Hepatobiliary: Small gallstones within the gallbladder. No focal hepatic abnormality. No biliary ductal dilatation. Pancreas: No focal abnormality or ductal dilatation. Spleen: No focal abnormality.  Normal size. Adrenals/Urinary Tract: No adrenal abnormality. No focal renal abnormality. No stones or hydronephrosis. Urinary bladder is unremarkable. Stomach/Bowel: Stomach, large and small bowel grossly unremarkable. Normal appendix. Vascular/Lymphatic: No evidence of aneurysm or adenopathy. Reproductive: Uterus and adnexa unremarkable.  No mass. Other: No free fluid or free air. Musculoskeletal: No acute bony abnormality. IMPRESSION: A few small layering gallstones in the gallbladder. No evidence of biliary duct dilatation or CT signs of acute cholecystitis. Normal appendix. No acute findings in the abdomen or pelvis. Electronically Signed   By: Rolm Baptise M.D.   On: 06/12/2017 20:02   US Abdomen Limited Ruq  Result Date: 06/12/2017 CLINICAL DATA:  Acute right upper quadrant abdominal pain. Mid back and upper abdominal pain for the past 3 days. EXAM: ULTRASOUND ABDOMEN LIMITED RIGHT UPPER QUADRANT COMPARISON:  None. FINDINGS: Gallbladder: Several small, mobile gallstones in the gallbladder measuring up to 8 mm in maximum diameter each. No gallbladder wall thickening or pericholecystic fluid. No  sonographic Murphy sign. Common bile duct: Diameter: 4.6 mm Liver: No focal lesion identified. Within normal limits in parenchymal echogenicity. Portal vein is patent on color Doppler imaging with normal direction of blood flow towards the liver. IMPRESSION: 1. Cholelithiasis without evidence of cholecystitis. 2. Otherwise, normal examination. Electronically Signed   By: Claudie Revering M.D.   On: 06/12/2017 14:39    Procedures Procedures (including critical care time)  Medications Ordered in ED Medications  sodium chloride 0.9 % injection (not administered)  iopamidol (ISOVUE-300) 61 % injection (100 mLs  Contrast Given 06/12/17 1945)     Initial Impression / Assessment and Plan / ED Course  I have reviewed the triage vital signs and the nursing notes.  Pertinent labs & imaging results that were available during my care of the patient were reviewed by me and considered in my medical decision making (see chart for details).    Patient presents with epigastric pain with radiation to the back.  Ultrasound today revealed cholelithiasis, but no cholecystitis.  White count was normal.  She has a pan elevation of her liver functions.  No acute abdomen.  Will discuss with general surgery.  Final Clinical Impressions(s) /  ED Diagnoses   Final diagnoses:  Biliary calculus of other site without obstruction  Elevated liver function tests    ED Discharge Orders    None       Nat Christen, MD 06/12/17 2036

## 2017-06-12 NOTE — ED Triage Notes (Signed)
Patient states she was sent by [facility] hospital stating she needs surgery to remove her gallbladder. Pt adds she has lab work and [place] completed PTA. Pain under control in triage. Was given GI cocktail before leaving [facility]. Pt A&Ox4.

## 2017-06-12 NOTE — ED Notes (Signed)
ED TO INPATIENT HANDOFF REPORT  Name/Age/Gender Sharon Browning 27 y.o. female  Code Status Code Status History    Date Active Date Inactive Code Status Order ID Comments User Context   03/27/2017 20:40 03/29/2017 19:57 Full Code [id number]  Crawford Givens, MD Inpatient   03/27/2017 14:18 03/27/2017 20:40 Full Code [id number]  Crawford Givens, MD Inpatient   03/27/2017 14:04 03/27/2017 14:18 Full Code [id number]  Christy Gentles, RN Inpatient   04/10/2014 14:34 04/12/2014 16:04 Full Code [id number]  Donnel Saxon, CNM Inpatient   04/10/2014 03:36 04/10/2014 14:34 Full Code [id number]  Farrel Gordon, CNM Inpatient      Home/SNF/Other Home  [facility]  Level of Care/Admitting Diagnosis ED Disposition    ED Disposition Condition [place] [100102]  Level of Care: Med-Surg [16]  Diagnosis: Biliary colic [254270]  Admitting Physician: CCS, [place]  Attending Physician: CCS, MD [[id number]]  PT Class (Do Not Modify): Observation [104]  PT Acc Code (Do Not Modify): Observation [10022]       Medical History Past Medical History:  Diagnosis Date  . Ovarian cyst    dermoid  . SVD (spontaneous vaginal delivery) 2015, 2018   x 2 - SVD 03/27/17 epidural-no problems    Allergies Allergies  Allergen Reactions  . Sulfa Antibiotics Hives    IV Location/Drains/Wounds Patient Lines/Drains/Airways Status   Active Line/Drains/Airways    Name:   Placement date:   Placement time:   Site:   Days:   Peripheral IV 06/12/17 Left Wrist   06/12/17    1909    Wrist   less than 1   Incision (Closed) 05/17/17 Abdomen   05/17/17    1014     26   Incision (Closed) 05/17/17 Perineum   05/17/17    1014     26   Incision - 3 Ports Abdomen Umbilicus Left;Lower Right;Lower   05/17/17    0950     26          Labs/Imaging Results for orders placed or performed during the hospital encounter of  06/12/17 (from the past 48 hour(s))  Urinalysis, Routine w reflex microscopic     Status: Abnormal   Collection Time: 06/12/17 10:25 AM  Result Value Ref Range   Color, Urine YELLOW YELLOW   APPearance HAZY (A) CLEAR   Specific Gravity, Urine 1.014 1.005 - 1.030   pH 7.0 5.0 - 8.0   Glucose, UA NEGATIVE NEGATIVE mg/dL   Hgb urine dipstick SMALL (A) NEGATIVE   Bilirubin Urine NEGATIVE NEGATIVE   Ketones, ur NEGATIVE NEGATIVE mg/dL   Protein, ur NEGATIVE NEGATIVE mg/dL   Nitrite NEGATIVE NEGATIVE   Leukocytes, UA LARGE (A) NEGATIVE   RBC / HPF 0-5 0 - 5 RBC/hpf   WBC, UA 6-30 0 - 5 WBC/hpf   Bacteria, UA RARE (A) NONE SEEN   Squamous Epithelial / LPF 6-30 (A) NONE SEEN    Comment: Performed at [facility], [address], [place], [place] [address]  Pregnancy, urine POC     Status: None   Collection Time: 06/12/17 10:50 AM  Result Value Ref Range   Preg Test, Ur NEGATIVE NEGATIVE    Comment:        THE SENSITIVITY OF THIS METHODOLOGY IS >24 mIU/mL   CBC with Differential     Status: None   Collection Time: 06/12/17  2:07 PM  Result Value Ref Range  WBC 5.8 4.0 - 10.5 K/uL   RBC 4.73 3.87 - 5.11 MIL/uL   Hemoglobin 13.0 12.0 - 15.0 g/dL   HCT 39.4 36.0 - 46.0 %   MCV 83.3 78.0 - 100.0 fL   MCH 27.5 26.0 - 34.0 pg   MCHC 33.0 30.0 - 36.0 g/dL   RDW 13.7 11.5 - 15.5 %   Platelets 220 150 - 400 K/uL   Neutrophils Relative % 61 %   Neutro Abs 3.4 1.7 - 7.7 K/uL   Lymphocytes Relative 34 %   Lymphs Abs 2.0 0.7 - 4.0 K/uL   Monocytes Relative 5 %   Monocytes Absolute 0.3 0.1 - 1.0 K/uL   Eosinophils Relative 0 %   Eosinophils Absolute 0.0 0.0 - 0.7 K/uL   Basophils Relative 0 %   Basophils Absolute 0.0 0.0 - 0.1 K/uL    Comment: Performed at [facility], [address], [place], [place] [address]  Comprehensive metabolic panel     Status: Abnormal   Collection Time: 06/12/17  2:07 PM  Result Value Ref Range   Sodium 137 135 - 145 mmol/L   Potassium 4.3 3.5 -  5.1 mmol/L   Chloride 107 101 - 111 mmol/L   CO2 21 (L) 22 - 32 mmol/L   Glucose, Bld 96 65 - 99 mg/dL   BUN 11 6 - 20 mg/dL   Creatinine, Ser 0.97 0.44 - 1.00 mg/dL   Calcium 8.9 8.9 - 10.3 mg/dL   Total Protein 7.8 6.5 - 8.1 g/dL   Albumin 4.0 3.5 - 5.0 g/dL   AST 369 (H) 15 - 41 U/L   ALT 286 (H) 14 - 54 U/L   Alkaline Phosphatase 154 (H) 38 - 126 U/L   Total Bilirubin 1.3 (H) 0.3 - 1.2 mg/dL   GFR calc non Af Amer >60 >60 mL/min   GFR calc Af Amer >60 >60 mL/min    Comment: (NOTE) The eGFR has been calculated using the CKD EPI equation. This calculation has not been validated in all clinical situations. eGFR's persistently <60 mL/min signify possible Chronic Kidney Disease.    Anion gap 9 5 - 15    Comment: Performed at [facility], [address] [place], [place] [address]  Lipase, blood     Status: None   Collection Time: 06/12/17  2:07 PM  Result Value Ref Range   Lipase 47 11 - 51 U/L    Comment: Performed at [facility], [address] [place],  [address]   Ct Abdomen Pelvis W Contrast  Result Date: 06/12/2017 CLINICAL DATA:  Abdominal pain.  Known gallstones EXAM: CT ABDOMEN AND PELVIS WITH CONTRAST TECHNIQUE: Multidetector CT imaging of the abdomen and pelvis was performed using the standard protocol following bolus administration of intravenous contrast. CONTRAST:  151m ISOVUE-300 IOPAMIDOL (ISOVUE-300) INJECTION 61% COMPARISON:  Ultrasound 06/12/2017 FINDINGS: Lower chest: Lung bases are clear. No effusions. Heart is normal size. Hepatobiliary: Small gallstones within the gallbladder. No focal hepatic abnormality. No biliary ductal dilatation. Pancreas: No focal abnormality or ductal dilatation. Spleen: No focal abnormality.  Normal size. Adrenals/Urinary Tract: No adrenal abnormality. No focal renal abnormality. No stones or hydronephrosis. Urinary bladder is unremarkable. Stomach/Bowel: Stomach, large and small bowel grossly unremarkable. Normal  appendix. Vascular/Lymphatic: No evidence of aneurysm or adenopathy. Reproductive: Uterus and adnexa unremarkable.  No mass. Other: No free fluid or free air. Musculoskeletal: No acute bony abnormality. IMPRESSION: A few small layering gallstones in the gallbladder. No evidence of biliary duct dilatation or  CT signs of acute cholecystitis. Normal appendix. No acute findings in the abdomen or pelvis. Electronically Signed   By: Rolm Baptise M.D.   On: 06/12/2017 20:02   US Abdomen Limited Ruq  Result Date: 06/12/2017 CLINICAL DATA:  Acute right upper quadrant abdominal pain. Mid back and upper abdominal pain for the past 3 days. EXAM: ULTRASOUND ABDOMEN LIMITED RIGHT UPPER QUADRANT COMPARISON:  None. FINDINGS: Gallbladder: Several small, mobile gallstones in the gallbladder measuring up to 8 mm in maximum diameter each. No gallbladder wall thickening or pericholecystic fluid. No sonographic Murphy sign. Common bile duct: Diameter: 4.6 mm Liver: No focal lesion identified. Within normal limits in parenchymal echogenicity. Portal vein is patent on color Doppler imaging with normal direction of blood flow towards the liver. IMPRESSION: 1. Cholelithiasis without evidence of cholecystitis. 2. Otherwise, normal examination. Electronically Signed   By: Claudie Revering M.D.   On: 06/12/2017 14:39    Pending Labs [employer] (From admission, onward)   Start     Ordered   Signed and Held  Comprehensive metabolic panel  Tomorrow morning,   R     Signed and Held   Signed and Held  CBC  Tomorrow morning,   R     Signed and Held      Vitals/Pain Today's Vitals   06/12/17 1843 06/12/17 1909 06/12/17 2001 06/12/17 2215  BP: 132/87   122/84  Pulse: 78   (!) 56  Resp: 16   20  Temp: 98 F (36.7 C)     TempSrc: Oral     SpO2: 100%   100%  PainSc:  0-No pain 0-No pain     Isolation Precautions No active isolations  Medications Medications  sodium chloride 0.9 % injection (not administered)  iopamidol  (ISOVUE-300) 61 % injection (100 mLs  Contrast Given 06/12/17 1945)    Mobility walks

## 2017-06-13 ENCOUNTER — Encounter (HOSPITAL_COMMUNITY)
Admission: EM | Disposition: A | Discharge status: Home / Self Care | Payer: Self-pay | Source: Home / Self Care | Attending: Emergency Medicine

## 2017-06-13 ENCOUNTER — Observation Stay (HOSPITAL_COMMUNITY): Payer: 59 | Admitting: Anesthesiology

## 2017-06-13 ENCOUNTER — Observation Stay (HOSPITAL_COMMUNITY): Payer: 59

## 2017-06-13 DIAGNOSIS — K8010 Calculus of gallbladder with chronic cholecystitis without obstruction: Secondary | ICD-10-CM | POA: Diagnosis not present

## 2017-06-13 HISTORY — PX: CHOLECYSTECTOMY: SHX55

## 2017-06-13 LAB — COMPREHENSIVE METABOLIC PANEL
ALBUMIN: 3.5 g/dL (ref 3.5–5.0)
ALT: 597 U/L — ABNORMAL HIGH (ref 14–54)
ANION GAP: 7 (ref 5–15)
AST: 617 U/L — ABNORMAL HIGH (ref 15–41)
Alkaline Phosphatase: 179 U/L — ABNORMAL HIGH (ref 38–126)
BILIRUBIN TOTAL: 2.1 mg/dL — AB (ref 0.3–1.2)
BUN: 10 mg/dL (ref 6–20)
CHLORIDE: 109 mmol/L (ref 101–111)
CO2: 22 mmol/L (ref 22–32)
Calcium: 8.6 mg/dL — ABNORMAL LOW (ref 8.9–10.3)
Creatinine, Ser: 0.98 mg/dL (ref 0.44–1.00)
GFR calc Af Amer: >60 mL/min (ref >60)
GFR calc non Af Amer: >60 mL/min (ref >60)
GLUCOSE: 100 mg/dL — AB (ref 65–99)
POTASSIUM: 3.7 mmol/L (ref 3.5–5.1)
SODIUM: 138 mmol/L (ref 135–145)
TOTAL PROTEIN: 7.1 g/dL (ref 6.5–8.1)

## 2017-06-13 LAB — CBC
HEMATOCRIT: 37.5 % (ref 36.0–46.0)
HEMOGLOBIN: 12.3 g/dL (ref 12.0–15.0)
MCH: 27.7 pg (ref 26.0–34.0)
MCHC: 32.8 g/dL (ref 30.0–36.0)
MCV: 84.5 fL (ref 78.0–100.0)
Platelets: 224 10*3/uL (ref 150–400)
RBC: 4.44 MIL/uL (ref 3.87–5.11)
RDW: 14 % (ref 11.5–15.5)
WBC: 4 10*3/uL (ref 4.0–10.5)

## 2017-06-13 LAB — MRSA PCR SCREENING: MRSA by PCR: NEGATIVE

## 2017-06-13 SURGERY — LAPAROSCOPIC CHOLECYSTECTOMY WITH INTRAOPERATIVE CHOLANGIOGRAM
Anesthesia: General

## 2017-06-13 MED ORDER — SUCCINYLCHOLINE CHLORIDE 200 MG/10ML IV SOSY
PREFILLED_SYRINGE | INTRAVENOUS | Status: AC
  Filled 2017-06-13: qty 10

## 2017-06-13 MED ORDER — PROPOFOL 10 MG/ML IV BOLUS
INTRAVENOUS | Status: DC | PRN
  Administered 2017-06-13: 170 mg via INTRAVENOUS

## 2017-06-13 MED ORDER — ONDANSETRON HCL 4 MG/2ML IJ SOLN
INTRAMUSCULAR | Status: AC
  Filled 2017-06-13: qty 2

## 2017-06-13 MED ORDER — FENTANYL CITRATE (PF) 100 MCG/2ML IJ SOLN
25.0000 ug | INTRAMUSCULAR | Status: DC | PRN
  Administered 2017-06-13 (×4): 50 ug via INTRAVENOUS

## 2017-06-13 MED ORDER — MIDAZOLAM HCL 2 MG/2ML IJ SOLN
INTRAMUSCULAR | Status: AC
  Filled 2017-06-13: qty 2

## 2017-06-13 MED ORDER — FENTANYL CITRATE (PF) 250 MCG/5ML IJ SOLN
INTRAMUSCULAR | Status: AC
  Filled 2017-06-13: qty 5

## 2017-06-13 MED ORDER — FENTANYL CITRATE (PF) 100 MCG/2ML IJ SOLN
INTRAMUSCULAR | Status: AC
  Filled 2017-06-13: qty 2

## 2017-06-13 MED ORDER — PROPOFOL 10 MG/ML IV BOLUS
INTRAVENOUS | Status: AC
  Filled 2017-06-13: qty 20

## 2017-06-13 MED ORDER — FENTANYL CITRATE (PF) 250 MCG/5ML IJ SOLN
INTRAMUSCULAR | Status: DC | PRN
  Administered 2017-06-13: 50 ug via INTRAVENOUS
  Administered 2017-06-13: 100 ug via INTRAVENOUS
  Administered 2017-06-13 (×2): 50 ug via INTRAVENOUS
  Administered 2017-06-13: 100 ug via INTRAVENOUS

## 2017-06-13 MED ORDER — RINGERS IRRIGATION IR SOLN
Status: DC | PRN
  Administered 2017-06-13: 1

## 2017-06-13 MED ORDER — ONDANSETRON HCL 4 MG/2ML IJ SOLN
INTRAMUSCULAR | Status: DC | PRN
  Administered 2017-06-13: 4 mg via INTRAVENOUS

## 2017-06-13 MED ORDER — LACTATED RINGERS IV SOLN
INTRAVENOUS | Status: DC
  Administered 2017-06-13: 09:00:00 via INTRAVENOUS

## 2017-06-13 MED ORDER — MIDAZOLAM HCL 5 MG/5ML IJ SOLN
INTRAMUSCULAR | Status: DC | PRN
  Administered 2017-06-13: 2 mg via INTRAVENOUS

## 2017-06-13 MED ORDER — DEXAMETHASONE SODIUM PHOSPHATE 10 MG/ML IJ SOLN
INTRAMUSCULAR | Status: DC | PRN
  Administered 2017-06-13: 10 mg via INTRAVENOUS

## 2017-06-13 MED ORDER — BUPIVACAINE-EPINEPHRINE (PF) 0.5% -1:200000 IJ SOLN
INTRAMUSCULAR | Status: AC
  Filled 2017-06-13: qty 30

## 2017-06-13 MED ORDER — OXYCODONE HCL 5 MG PO TABS: 5.0000 mg | ORAL_TABLET | Freq: Once | ORAL | Status: DC | PRN

## 2017-06-13 MED ORDER — DEXAMETHASONE SODIUM PHOSPHATE 10 MG/ML IJ SOLN
INTRAMUSCULAR | Status: AC
  Filled 2017-06-13: qty 1

## 2017-06-13 MED ORDER — LIDOCAINE 2% (20 MG/ML) 5 ML SYRINGE
INTRAMUSCULAR | Status: DC | PRN
  Administered 2017-06-13: 75 mg via INTRAVENOUS

## 2017-06-13 MED ORDER — OXYCODONE HCL 5 MG/5ML PO SOLN
5.0000 mg | Freq: Once | ORAL | Status: DC | PRN
  Filled 2017-06-13: qty 5

## 2017-06-13 MED ORDER — SUGAMMADEX SODIUM 200 MG/2ML IV SOLN
INTRAVENOUS | Status: DC | PRN
  Administered 2017-06-13: 200 mg via INTRAVENOUS

## 2017-06-13 MED ORDER — ROCURONIUM BROMIDE 10 MG/ML (PF) SYRINGE
PREFILLED_SYRINGE | INTRAVENOUS | Status: DC | PRN
  Administered 2017-06-13: 10 mg via INTRAVENOUS
  Administered 2017-06-13: 40 mg via INTRAVENOUS

## 2017-06-13 MED ORDER — MORPHINE SULFATE (PF) 2 MG/ML IV SOLN
1.0000 mg | INTRAVENOUS | Status: DC | PRN
  Administered 2017-06-13: 4 mg via INTRAVENOUS
  Administered 2017-06-13 – 2017-06-14 (×2): 2 mg via INTRAVENOUS
  Filled 2017-06-13: qty 2
  Filled 2017-06-13 (×3): qty 1

## 2017-06-13 MED ORDER — HYDROCODONE-ACETAMINOPHEN 5-325 MG PO TABS
1.0000 | ORAL_TABLET | ORAL | Status: DC | PRN
  Administered 2017-06-13 – 2017-06-14 (×2): 2 via ORAL
  Filled 2017-06-13 (×2): qty 2

## 2017-06-13 MED ORDER — BUPIVACAINE-EPINEPHRINE (PF) 0.5% -1:200000 IJ SOLN
INTRAMUSCULAR | Status: DC | PRN
  Administered 2017-06-13: 30 mL

## 2017-06-13 MED ORDER — SUGAMMADEX SODIUM 200 MG/2ML IV SOLN
INTRAVENOUS | Status: AC
  Filled 2017-06-13: qty 2

## 2017-06-13 MED ORDER — LIDOCAINE 2% (20 MG/ML) 5 ML SYRINGE
INTRAMUSCULAR | Status: AC
  Filled 2017-06-13: qty 5

## 2017-06-13 MED ORDER — SODIUM CHLORIDE 0.9 % IV SOLN
INTRAVENOUS | Status: DC | PRN
  Administered 2017-06-13: 13 mL

## 2017-06-13 MED ORDER — IOPAMIDOL (ISOVUE-300) INJECTION 61%
INTRAVENOUS | Status: AC
  Filled 2017-06-13: qty 50

## 2017-06-13 MED ORDER — SUCCINYLCHOLINE CHLORIDE 200 MG/10ML IV SOSY
PREFILLED_SYRINGE | INTRAVENOUS | Status: DC | PRN
  Administered 2017-06-13: 100 mg via INTRAVENOUS

## 2017-06-13 MED ORDER — ROCURONIUM BROMIDE 10 MG/ML (PF) SYRINGE
PREFILLED_SYRINGE | INTRAVENOUS | Status: AC
  Filled 2017-06-13: qty 5

## 2017-06-13 SURGICAL SUPPLY — 38 items
APPLIER CLIP 5 13 M/L LIGAMAX5 (MISCELLANEOUS)
APPLIER CLIP ROT 10 11.4 M/L (STAPLE) ×3
BENZOIN TINCTURE PRP APPL 2/3 (GAUZE/BANDAGES/DRESSINGS) IMPLANT
CABLE HIGH FREQUENCY MONO STRZ (ELECTRODE) ×3 IMPLANT
CHLORAPREP W/TINT 26ML (MISCELLANEOUS) ×3 IMPLANT
CHOLANGIOGRAM CATH TAUT (CATHETERS) ×3 IMPLANT
CLIP APPLIE 5 13 M/L LIGAMAX5 (MISCELLANEOUS) IMPLANT
CLIP APPLIE ROT 10 11.4 M/L (STAPLE) ×1 IMPLANT
CLOSURE WOUND 1/4X4 (GAUZE/BANDAGES/DRESSINGS)
COVER MAYO STAND STRL (DRAPES) ×3 IMPLANT
COVER SURGICAL LIGHT HANDLE (MISCELLANEOUS) ×3 IMPLANT
DECANTER SPIKE VIAL GLASS SM (MISCELLANEOUS) IMPLANT
DERMABOND ADVANCED (GAUZE/BANDAGES/DRESSINGS) ×2
DERMABOND ADVANCED .7 DNX12 (GAUZE/BANDAGES/DRESSINGS) ×1 IMPLANT
DRAPE C-ARM 42X120 X-RAY (DRAPES) ×3 IMPLANT
ELECT REM PT RETURN 15FT ADLT (MISCELLANEOUS) ×3 IMPLANT
GLOVE SURG SIGNA 7.5 PF LTX (GLOVE) ×3 IMPLANT
GOWN STRL REUS W/TWL XL LVL3 (GOWN DISPOSABLE) ×9 IMPLANT
HEMOSTAT SURGICEL 4X8 (HEMOSTASIS) IMPLANT
IV CATH 14GX2 1/4 (CATHETERS) ×3 IMPLANT
IV SET EXTENSION CATH 6 NF (IV SETS) ×3 IMPLANT
KIT BASIN OR (CUSTOM PROCEDURE TRAY) ×3 IMPLANT
POUCH RETRIEVAL ECOSAC 10 (ENDOMECHANICALS) ×1 IMPLANT
POUCH RETRIEVAL ECOSAC 10MM (ENDOMECHANICALS) ×2
SCISSORS LAP 5X35 DISP (ENDOMECHANICALS) ×3 IMPLANT
SET IRRIG TUBING LAPAROSCOPIC (IRRIGATION / IRRIGATOR) ×3 IMPLANT
SLEEVE ADV FIXATION 5X100MM (TROCAR) ×3 IMPLANT
STOPCOCK 4 WAY LG BORE MALE ST (IV SETS) ×3 IMPLANT
STRIP CLOSURE SKIN 1/4X4 (GAUZE/BANDAGES/DRESSINGS) IMPLANT
SUT MNCRL AB 4-0 PS2 18 (SUTURE) ×3 IMPLANT
SYR 10ML ECCENTRIC (SYRINGE) ×3 IMPLANT
TOWEL OR 17X26 10 PK STRL BLUE (TOWEL DISPOSABLE) ×3 IMPLANT
TOWEL OR NON WOVEN STRL DISP B (DISPOSABLE) ×3 IMPLANT
TRAY LAPAROSCOPIC (CUSTOM PROCEDURE TRAY) ×3 IMPLANT
TROCAR ADV FIXATION 11X100MM (TROCAR) ×3 IMPLANT
TROCAR ADV FIXATION 5X100MM (TROCAR) ×6 IMPLANT
TROCAR XCEL BLUNT TIP 100MML (ENDOMECHANICALS) ×3 IMPLANT
TUBING INSUF HEATED (TUBING) ×3 IMPLANT

## 2017-06-13 NOTE — Anesthesia Procedure Notes (Signed)
Procedure Name: Intubation Date/Time: 06/13/2017 9:40 AM Performed by: Lollie Sails, CRNA Pre-anesthesia Checklist: Patient identified, Emergency Drugs available, Suction available, Patient being monitored and Timeout performed Patient Re-evaluated:Patient Re-evaluated prior to induction Oxygen Delivery Method: Circle system utilized Preoxygenation: Pre-oxygenation with 100% oxygen Induction Type: IV induction Ventilation: Mask ventilation without difficulty Laryngoscope Size: Miller and 3 Grade View: Grade I Tube type: Oral Tube size: 7.5 mm Number of attempts: 1 Airway Equipment and Method: Stylet Placement Confirmation: ETT inserted through vocal cords under direct vision,  positive ETCO2 and breath sounds checked- equal and bilateral Tube secured with: Tape Dental Injury: Teeth and Oropharynx as per pre-operative assessment

## 2017-06-13 NOTE — Anesthesia Procedure Notes (Signed)
Performed by: Lollie Sails, CRNA

## 2017-06-13 NOTE — Op Note (Signed)
06/12/2017 - 06/13/2017  10:58 AM  PATIENT:  Sharon Browning, 27 y.o., female, MRN: [id number]  PREOP DIAGNOSIS:  cholelithiasis  POSTOP DIAGNOSIS:   Acute edematous cholecystitis, cholelithiasis  PROCEDURE:   Procedure(s): LAPAROSCOPIC CHOLECYSTECTOMY WITH INTRAOPERATIVE CHOLANGIOGRAM  SURGEON:   Alphonsa Overall, M.D.  ASSISTANT:   None  ANESTHESIA:   general  Anesthesiologist: Oleta Mouse, MD CRNA: Lollie Sails, CRNA  General  ASA: 2E  EBL:  Minimal  ml  BLOOD ADMINISTERED: none  DRAINS: none   LOCAL MEDICATIONS USED:   30 cc of 1/4% marcaine   SPECIMEN:   Gall bladder  COUNTS CORRECT:  YES  INDICATIONS FOR PROCEDURE:  Shabrea Chamberlin is a 27 y.o. (DOB: 1990/05/18) white female whose primary care physician is Patient, No Pcp Per and comes for cholecystectomy.   The indications and risks of the gall bladder surgery were explained to the patient.  The risks include, but are not limited to, infection, bleeding, common bile duct injury and open surgery.  SURGERY:  The patient was taken to OR room #4 at [facility].  The abdomen was prepped with chloroprep.  The patient was on Rocephin prior to the beginning of the operation.   A time out was held and the surgical checklist run.   An infraumbilical incision was made into the abdominal cavity.  A 12 mm Hasson trocar was inserted into the abdominal cavity through the infraumbilical incision and secured with a 0 Vicryl suture.  Three additional trocars were inserted: a 10 mm trocar in the sub-xiphoid location, a 5 mm trocar in the right mid subcostal area, and a 5 mm trocar in the right lateral subcostal area.   The abdomen was explored and the liver, stomach, and bowel that could be seen were unremarkable.   The gall bladder was distended and edematous.   I grasped the gall bladder and rotated it cephalad.  Disssection was carried down to the gall bladder/cystic duct junction and the cystic duct isolated.  A  clip was placed on the gall bladder side of the cystic duct.   An intra-operative cholangiogram was shot.   The intra-operative cholangiogram was shot using a cut off Taut catheter placed through a 14 gauge angiocath in the RUQ.  The Taut catheter was inserted in the cut cystic duct and secured with an endoclip.  A cholangiogram was shot with 12 cc of 1/2 strength Isoview.  Using fluoroscopy, the cholangiogram showed the flow of contrast into the common bile duct, up the hepatic radicals, and into the duodenum. The contrast trickled into the duodenum, implying some edema of the ampulla.  I did not see a filling defect.  There was no mass or obstruction.  I will plan to follow her liver function tests.   The Taut catheter was removed.  The cystic duct was tripley endoclipped and the cystic artery was identified and clipped.  The gall bladder was bluntly and sharpley dissected from the gall bladder bed.   After the gall bladder was removed from the liver, the gall bladder bed and Triangle of [place] were inspected.  There was no bleeding or bile leak.  The gall bladder was placed in a endocatch bag and delivered through the umbilicus.  The abdomen was irrigated with 800 cc saline.   The trocars were then removed.  I infiltrated 30 cc of 1/4% Marcaine into the incisions.  The umbilical port closed with a 0 Vicryl suture and the skin closed with 4-0 Monocryl.  The skin  was painted with DermaBond.  The patient's sponge and needle count were correct.  The patient was transported to the RR in good condition.  Alphonsa Overall, MD, [facility] Surgery Pager: [phone] Office phone:  [phone]

## 2017-06-13 NOTE — Discharge Instructions (Signed)

## 2017-06-13 NOTE — Transfer of Care (Signed)
Immediate Anesthesia Transfer of Care Note  Patient: Medical sales representative  Procedure(s) Performed: LAPAROSCOPIC CHOLECYSTECTOMY WITH INTRAOPERATIVE CHOLANGIOGRAM (N/A )  Patient Location: PACU  Anesthesia Type:General  Level of Consciousness: awake, alert  and patient cooperative  Airway & Oxygen Therapy: Patient Spontanous Breathing and Patient connected to face mask oxygen  Post-op Assessment: Report given to RN and Post -op Vital signs reviewed and stable  Post vital signs: Reviewed and stable  Last Vitals:  Vitals:   06/12/17 2309 06/13/17 0547  BP: 115/82 109/68  Pulse: 74 (!) 55  Resp: 18 18  Temp: 36.5 C 36.9 C  SpO2: 99% 99%    Last Pain:  Vitals:   06/13/17 0655  TempSrc:   PainSc: 0-No pain         Complications: No apparent anesthesia complications

## 2017-06-13 NOTE — Progress Notes (Signed)
[facility] Office:  [phone] General Surgery Progress Note   LOS: 0 days  POD -     Chief Complaint: Abdominal pain  Assessment and Plan: 1.  Gall bladder disease, cholelithiasis  On rocephin   I discussed with the patient the indications and risks of gall bladder surgery.  The primary risks of gall bladder surgery include, but are not limited to, bleeding, infection, common bile duct injury, and open surgery.  There is also the risk that the patient may have continued symptoms after surgery.  We discussed the typical post-operative recovery course. I tried to answer the patient's questions.  Plan surgery this AM  2.  Elevated LFT  Will do cholangiogram  3.  Recent left ovarian cystectomy - 05/17/2017 - [facility]  4.  Vaginal delivery, boy (2nd child) - 03/27/2017 5.   DVT prophylaxis - Lovenox   Active Problems:   Biliary colic  Subjective:  Epigastric/RUQ pain for the last 5 days.  She thinks that she is over the left ovarian cystectomy.  Objective:   Vitals:   06/12/17 2309 06/13/17 0547  BP: 115/82 109/68  Pulse: 74 (!) 55  Resp: 18 18  Temp: 97.7 F (36.5 C) 98.5 F (36.9 C)  SpO2: 99% 99%     Intake/Output from previous day:  02/27 0701 - 02/28 0700 In: 568.8 [I.V.:468.8; IV Piggyback:100] Out: -   Intake/Output this shift:  No intake/output data recorded.   Physical Exam:   General: Obese WF who is alert and oriented.    HEENT: Normal. Pupils equal. .   Lungs: Clear   Abdomen: Soft.  No guarding or tenderness.   Lab Results:    Recent Labs    06/12/17 1407 06/13/17 0414  WBC 5.8 4.0  HGB 13.0 12.3  HCT 39.4 37.5  PLT 220 224    BMET   Recent Labs    06/12/17 1407 06/13/17 0414  NA 137 138  K 4.3 3.7  CL 107 109  CO2 21* 22  GLUCOSE 96 100*  BUN 11 10  CREATININE 0.97 0.98  CALCIUM 8.9 8.6*    PT/INR  No results for input(s): LABPROT, INR in the last 72 hours.  ABG  No results for input(s): PHART, HCO3 in the  last 72 hours.  Invalid input(s): PCO2, PO2   Studies/Results:  Ct Abdomen Pelvis W Contrast  Result Date: 06/12/2017 CLINICAL DATA:  Abdominal pain.  Known gallstones EXAM: CT ABDOMEN AND PELVIS WITH CONTRAST TECHNIQUE: Multidetector CT imaging of the abdomen and pelvis was performed using the standard protocol following bolus administration of intravenous contrast. CONTRAST:  123mL ISOVUE-300 IOPAMIDOL (ISOVUE-300) INJECTION 61% COMPARISON:  Ultrasound 06/12/2017 FINDINGS: Lower chest: Lung bases are clear. No effusions. Heart is normal size. Hepatobiliary: Small gallstones within the gallbladder. No focal hepatic abnormality. No biliary ductal dilatation. Pancreas: No focal abnormality or ductal dilatation. Spleen: No focal abnormality.  Normal size. Adrenals/Urinary Tract: No adrenal abnormality. No focal renal abnormality. No stones or hydronephrosis. Urinary bladder is unremarkable. Stomach/Bowel: Stomach, large and small bowel grossly unremarkable. Normal appendix. Vascular/Lymphatic: No evidence of aneurysm or adenopathy. Reproductive: Uterus and adnexa unremarkable.  No mass. Other: No free fluid or free air. Musculoskeletal: No acute bony abnormality. IMPRESSION: A few small layering gallstones in the gallbladder. No evidence of biliary duct dilatation or CT signs of acute cholecystitis. Normal appendix. No acute findings in the abdomen or pelvis. Electronically Signed   By: Rolm Baptise M.D.   On: 06/12/2017 20:02  US Abdomen Limited Ruq  Result Date: 06/12/2017 CLINICAL DATA:  Acute right upper quadrant abdominal pain. Mid back and upper abdominal pain for the past 3 days. EXAM: ULTRASOUND ABDOMEN LIMITED RIGHT UPPER QUADRANT COMPARISON:  None. FINDINGS: Gallbladder: Several small, mobile gallstones in the gallbladder measuring up to 8 mm in maximum diameter each. No gallbladder wall thickening or pericholecystic fluid. No sonographic Murphy sign. Common bile duct: Diameter: 4.6 mm Liver: No  focal lesion identified. Within normal limits in parenchymal echogenicity. Portal vein is patent on color Doppler imaging with normal direction of blood flow towards the liver. IMPRESSION: 1. Cholelithiasis without evidence of cholecystitis. 2. Otherwise, normal examination. Electronically Signed   By: Claudie Revering M.D.   On: 06/12/2017 14:39     Anti-infectives:   Anti-infectives (From admission, onward)   Start     Dose/Rate Route Frequency Ordered Stop   06/12/17 2315  cefTRIAXone (ROCEPHIN) 2 g in sodium chloride 0.9 % 100 mL IVPB     2 g 200 mL/hr over 30 Minutes Intravenous Daily at bedtime 06/12/17 2303        Alphonsa Overall, MD, FACS Pager: [facility] Office: [phone] 06/13/2017

## 2017-06-13 NOTE — Anesthesia Preprocedure Evaluation (Signed)
Anesthesia Evaluation  Patient identified by MRN, date of birth, ID band Patient awake    Reviewed: Allergy & Precautions, NPO status , Patient's Chart, lab work & pertinent test results  History of Anesthesia Complications Negative for: history of anesthetic complications  Airway Mallampati: II  TM Distance: >3 FB Neck ROM: Full    Dental  (+) Teeth Intact, Dental Advisory Given, Chipped,    Pulmonary neg shortness of breath, neg sleep apnea, neg COPD, neg recent URI, former smoker,    Pulmonary exam normal breath sounds clear to auscultation       Cardiovascular Exercise Tolerance: Good negative cardio ROS Normal cardiovascular exam Rhythm:Regular Rate:Normal     Neuro/Psych negative neurological ROS  negative psych ROS   GI/Hepatic Neg liver ROS, cholelithiasis   Endo/Other  negative endocrine ROS  Renal/GU negative Renal ROS     Musculoskeletal negative musculoskeletal ROS (+)   Abdominal   Peds  Hematology negative hematology ROS (+)   Anesthesia Other Findings Day of surgery medications reviewed with the patient.  Reproductive/Obstetrics Dermoid ovarian cyst                              Anesthesia Physical Anesthesia Plan  ASA: II  Anesthesia Plan: General   Post-op Pain Management:    Induction: Intravenous  PONV Risk Score and Plan: 3 and Dexamethasone and Ondansetron  Airway Management Planned: Oral ETT  Additional Equipment: None  Intra-op Plan:   Post-operative Plan: Extubation in OR  Informed Consent: I have reviewed the patients History and Physical, chart, labs and discussed the procedure including the risks, benefits and alternatives for the proposed anesthesia with the patient or authorized representative who has indicated his/her understanding and acceptance.   Dental advisory given  Plan Discussed with: CRNA and Surgeon  Anesthesia Plan Comments:           Anesthesia Quick Evaluation

## 2017-06-14 ENCOUNTER — Encounter (HOSPITAL_COMMUNITY): Payer: Self-pay | Admitting: Surgery

## 2017-06-14 DIAGNOSIS — K8010 Calculus of gallbladder with chronic cholecystitis without obstruction: Secondary | ICD-10-CM | POA: Diagnosis not present

## 2017-06-14 LAB — COMPREHENSIVE METABOLIC PANEL
ALT: 567 U/L — ABNORMAL HIGH (ref 14–54)
ANION GAP: 7 (ref 5–15)
AST: 283 U/L — ABNORMAL HIGH (ref 15–41)
Albumin: 3.3 g/dL — ABNORMAL LOW (ref 3.5–5.0)
Alkaline Phosphatase: 182 U/L — ABNORMAL HIGH (ref 38–126)
BUN: 7 mg/dL (ref 6–20)
CO2: 21 mmol/L — ABNORMAL LOW (ref 22–32)
Calcium: 8.9 mg/dL (ref 8.9–10.3)
Chloride: 111 mmol/L (ref 101–111)
Creatinine, Ser: 0.91 mg/dL (ref 0.44–1.00)
GFR calc Af Amer: >60 mL/min (ref >60)
Glucose, Bld: 119 mg/dL — ABNORMAL HIGH (ref 65–99)
POTASSIUM: 4.1 mmol/L (ref 3.5–5.1)
Sodium: 139 mmol/L (ref 135–145)
TOTAL PROTEIN: 6.6 g/dL (ref 6.5–8.1)
Total Bilirubin: 1.7 mg/dL — ABNORMAL HIGH (ref 0.3–1.2)

## 2017-06-14 MED ORDER — HYDROCODONE-ACETAMINOPHEN 5-325 MG PO TABS
1.0000 | ORAL_TABLET | ORAL | 0 refills | Status: AC | PRN
Start: 2017-06-14 — End: ?

## 2017-06-14 NOTE — Anesthesia Postprocedure Evaluation (Signed)
Anesthesia Post Note  Patient: Medical sales representative  Procedure(s) Performed: LAPAROSCOPIC CHOLECYSTECTOMY WITH INTRAOPERATIVE CHOLANGIOGRAM (N/A )     Patient location during evaluation: PACU Anesthesia Type: General Level of consciousness: awake and alert Pain management: pain level controlled Vital Signs Assessment: post-procedure vital signs reviewed and stable Respiratory status: spontaneous breathing, nonlabored ventilation, respiratory function stable and patient connected to nasal cannula oxygen Cardiovascular status: blood pressure returned to baseline and stable Postop Assessment: no apparent nausea or vomiting Anesthetic complications: no    Last Vitals:  Vitals:   06/14/17 0206 06/14/17 0559  BP: 105/63 110/69  Pulse: 67 80  Resp: 18 18  Temp: 37.1 C 37 C  SpO2: 97% 98%    Last Pain:  Vitals:   06/14/17 0648  TempSrc:   PainSc: 2                  Ioane Bhola

## 2017-06-14 NOTE — Progress Notes (Signed)
Pt alert, oriented, tolerating diet.  D/C instructions and prescription was given.  All questions answered.

## 2017-06-14 NOTE — Discharge Summary (Addendum)
[facility] Discharge Summary   Patient ID: Sharon Browning MRN: [id number] DOB/AGE: [birth year] 27 y.o.  Admit date: 06/12/2017 Discharge date: 06/14/2017  Admitting Diagnosis: Biliary Colic  Discharge Diagnosis S/p laparoscopic cholecystectomy  Consultants None  Imaging: Dg Cholangiogram Operative  Result Date: 06/13/2017 CLINICAL DATA:  Intraoperative cholangiogram during laparoscopic cholecystectomy. EXAM: INTRAOPERATIVE CHOLANGIOGRAM FLUOROSCOPY TIME:  15 seconds (8 mGy) COMPARISON:  CT abdomen and pelvis - 06/12/2017 FINDINGS: Intraoperative cholangiographic images of the right upper abdominal quadrant during laparoscopic cholecystectomy are provided for review. Surgical clips overlie the expected location of the gallbladder fossa. Contrast injection demonstrates selective cannulation of the central aspect of the cystic duct. There is passage of contrast through the central aspect of the cystic duct with filling of a non dilated common bile duct. There is passage of contrast though the CBD and into the descending portion of the duodenum. There is minimal reflux of injected contrast into the common hepatic duct and central aspect of the non dilated intrahepatic biliary system. There is minimal contrast extravasation about the cystic duct cannulation site. There are no discrete filling defects within the opacified portions of the biliary system to suggest the presence of choledocholithiasis. IMPRESSION: No evidence of choledocholithiasis. Electronically Signed   By: Sandi Mariscal M.D.   On: 06/13/2017 11:13   Ct Abdomen Pelvis W Contrast  Result Date: 06/12/2017 CLINICAL DATA:  Abdominal pain.  Known gallstones EXAM: CT ABDOMEN AND PELVIS WITH CONTRAST TECHNIQUE: Multidetector CT imaging of the abdomen and pelvis was performed using the standard protocol following bolus administration of intravenous contrast. CONTRAST:  119mL ISOVUE-300 IOPAMIDOL (ISOVUE-300) INJECTION 61%  COMPARISON:  Ultrasound 06/12/2017 FINDINGS: Lower chest: Lung bases are clear. No effusions. Heart is normal size. Hepatobiliary: Small gallstones within the gallbladder. No focal hepatic abnormality. No biliary ductal dilatation. Pancreas: No focal abnormality or ductal dilatation. Spleen: No focal abnormality.  Normal size. Adrenals/Urinary Tract: No adrenal abnormality. No focal renal abnormality. No stones or hydronephrosis. Urinary bladder is unremarkable. Stomach/Bowel: Stomach, large and small bowel grossly unremarkable. Normal appendix. Vascular/Lymphatic: No evidence of aneurysm or adenopathy. Reproductive: Uterus and adnexa unremarkable.  No mass. Other: No free fluid or free air. Musculoskeletal: No acute bony abnormality. IMPRESSION: A few small layering gallstones in the gallbladder. No evidence of biliary duct dilatation or CT signs of acute cholecystitis. Normal appendix. No acute findings in the abdomen or pelvis. Electronically Signed   By: Rolm Baptise M.D.   On: 06/12/2017 20:02   US Abdomen Limited Ruq  Result Date: 06/12/2017 CLINICAL DATA:  Acute right upper quadrant abdominal pain. Mid back and upper abdominal pain for the past 3 days. EXAM: ULTRASOUND ABDOMEN LIMITED RIGHT UPPER QUADRANT COMPARISON:  None. FINDINGS: Gallbladder: Several small, mobile gallstones in the gallbladder measuring up to 8 mm in maximum diameter each. No gallbladder wall thickening or pericholecystic fluid. No sonographic Murphy sign. Common bile duct: Diameter: 4.6 mm Liver: No focal lesion identified. Within normal limits in parenchymal echogenicity. Portal vein is patent on color Doppler imaging with normal direction of blood flow towards the liver. IMPRESSION: 1. Cholelithiasis without evidence of cholecystitis. 2. Otherwise, normal examination. Electronically Signed   By: Claudie Revering M.D.   On: 06/12/2017 14:39    Procedures Dr. Lucia Gaskins (06/13/17) - Laparoscopic Cholecystectomy with [facility]  Course:  Patient is a 27 year old female who presented to [facility] with RUQ pain.  Workup showed cholelithiasis.  Patient was admitted and underwent procedure listed above.  Tolerated procedure well and  was transferred to the floor.  Diet was advanced as tolerated.  On POD#1, the patient was voiding well, tolerating diet, ambulating well, pain well controlled, vital signs stable, incisions c/d/i and felt stable for discharge home.  Patient will follow up in our office in 2 weeks and knows to call with questions or concerns. She will call to confirm appointment date/time.    Physical Exam: General:  Alert, NAD, pleasant, comfortable Abd:  Soft, ND, mild tenderness, incisions C/D/I   I have personally looked this patient up in the [place] Controlled Substance Database and reviewed their medications.   Allergies as of 06/14/2017      Reactions   Sulfa Antibiotics Hives      Medication List    STOP taking these medications   oxyCODONE-acetaminophen 5-325 MG tablet Commonly known as:  PERCOCET/ROXICET     TAKE these medications   HYDROcodone-acetaminophen 5-325 MG tablet Commonly known as:  NORCO/VICODIN Take 1-2 tablets by mouth every 4 (four) hours as needed for moderate pain.   ibuprofen 800 MG tablet Commonly known as:  ADVIL,MOTRIN Take 800 mg by mouth every 8 (eight) hours as needed for moderate pain.        Follow-up Information    Surgery, [place]. Go on 06/27/2017.   Specialty:  General Surgery Why:  Your appointment is at 1:30 PM. Please arrive 30 min prior to appointment time. Bring photo ID and insurance information. Recheck labs 06/23/17 - take written prescription with you. Contact information: [address] [address] [phone]           Signed: Brigid Re, [facility] 06/14/2017, 7:57 AM Pager: [phone] Consults: [phone]  Agree with above.  Alphonsa Overall, MD, [facility] Pager:  [phone] Office phone:  [phone]

## 2019-01-09 IMAGING — US US PELVIS COMPLETE TRANSABD/TRANSVAG
1 series · 15 of 25 positions shown · non-contrast
Comparison: Pelvic ultrasound dated 08/12/2016

CLINICAL DATA: 26-year-old female with left lower quadrant and back
pain.

EXAM:
TRANSABDOMINAL AND TRANSVAGINAL ULTRASOUND OF PELVIS
TECHNIQUE: Both transabdominal and transvaginal ultrasound examinations of the
pelvis were performed. Transabdominal technique was performed for
global imaging of the pelvis including uterus, ovaries, adnexal
regions, and pelvic cul-de-sac. It was necessary to proceed with
endovaginal exam following the transabdominal exam to visualize the
endometrium and ovaries.

[Series 1: us pelvis complete transabd/transvag · 15 of 80 slices shown]
[im 1/80]
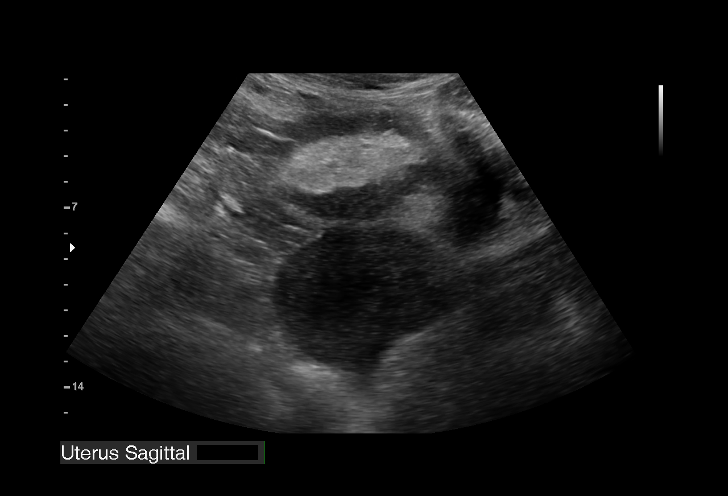
[im 7/80]
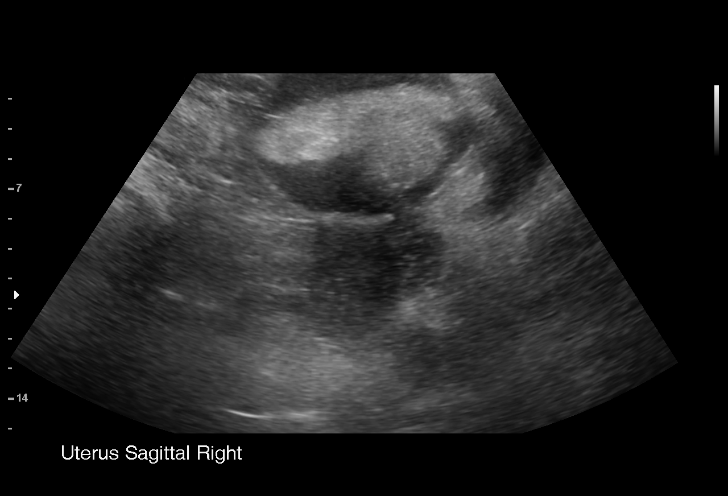
[im 14/80]
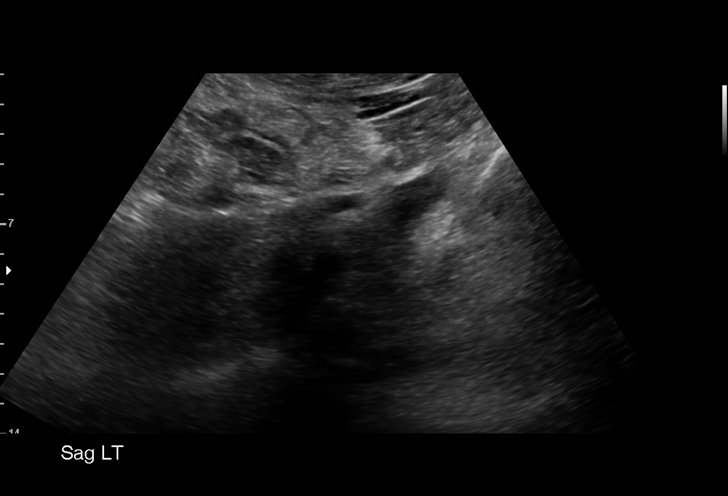
[im 17/80]
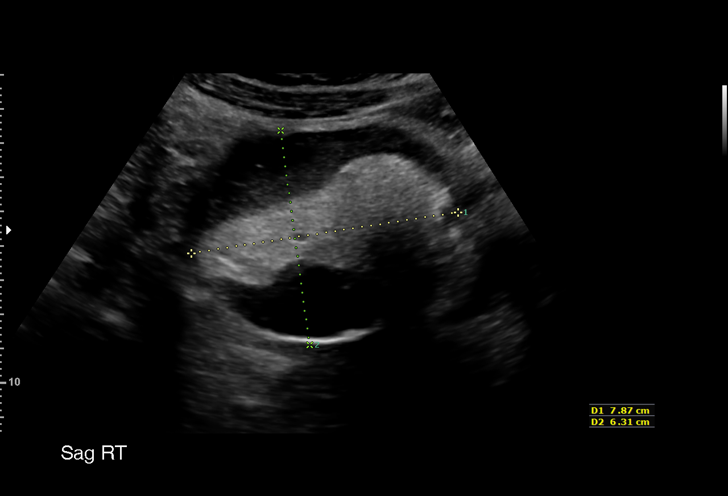
[im 24/80]
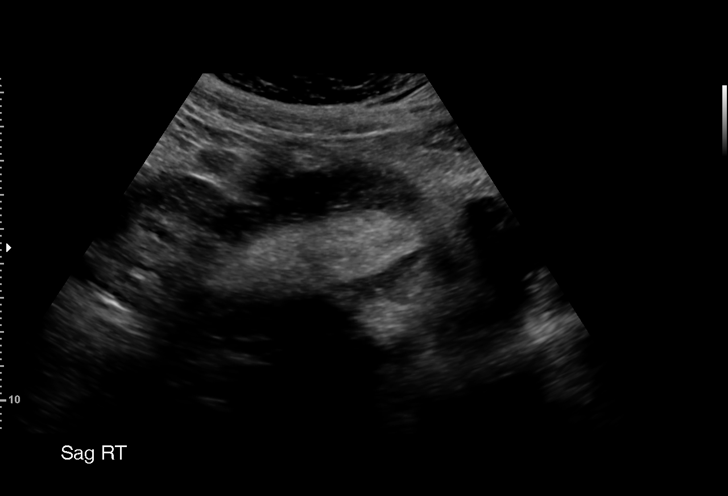
[im 30/80]
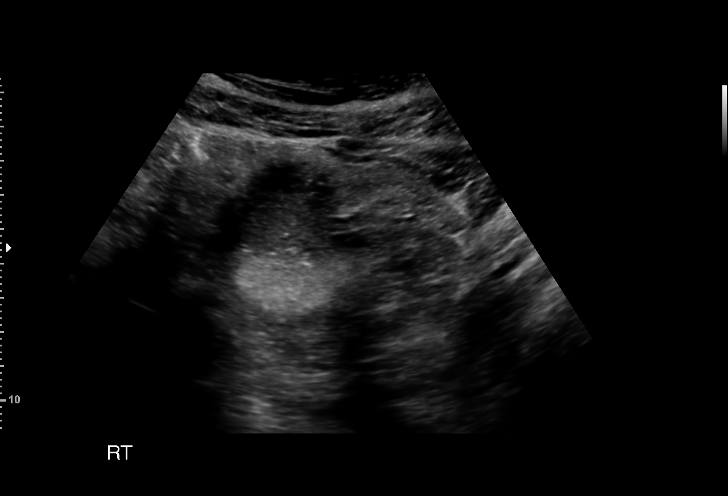
[im 33/80]
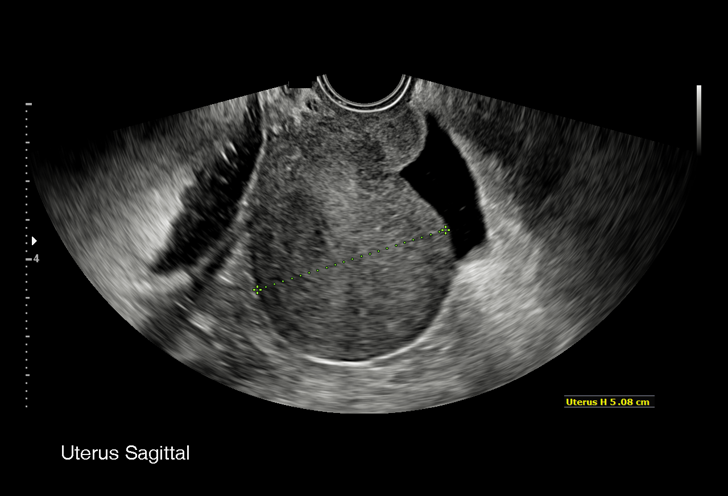
[im 40/80]
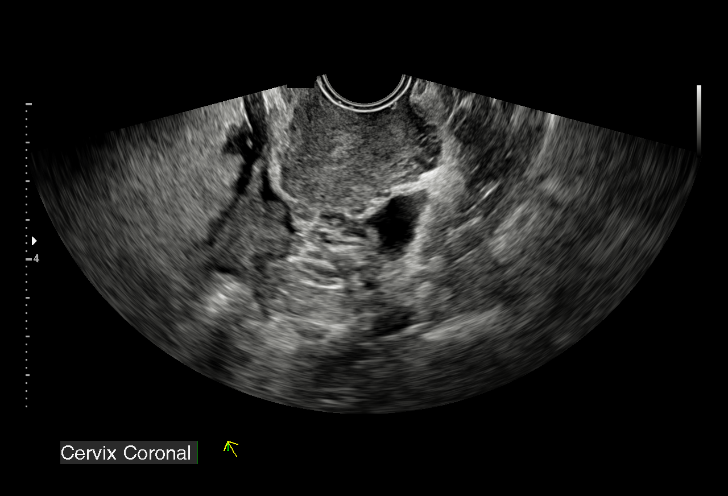
[im 47/80]
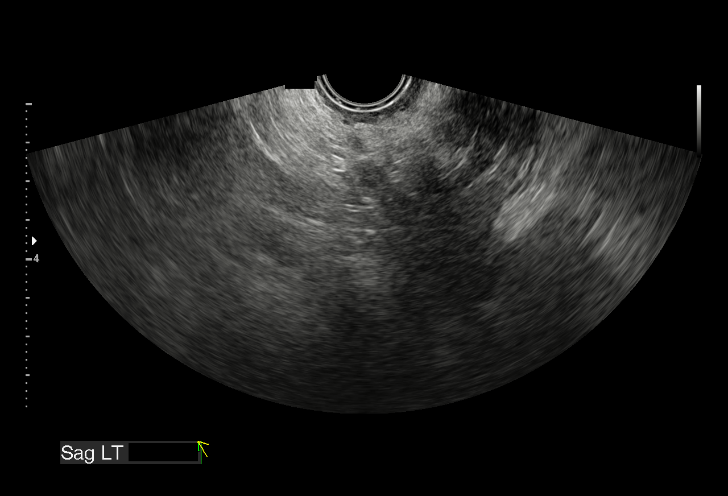
[im 50/80]
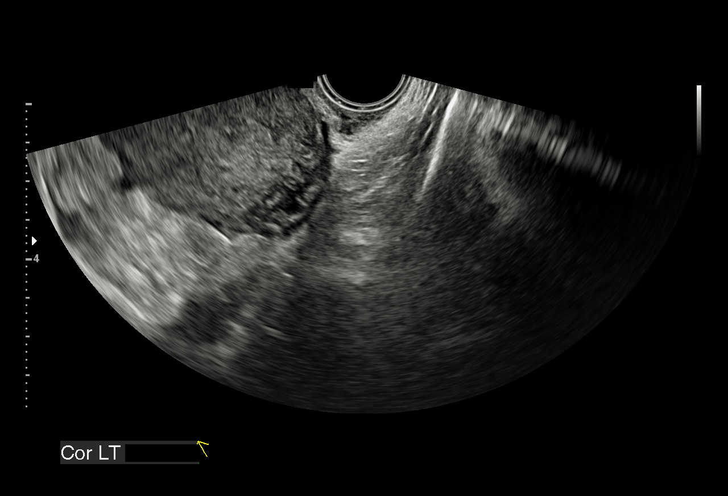
[im 56/80]
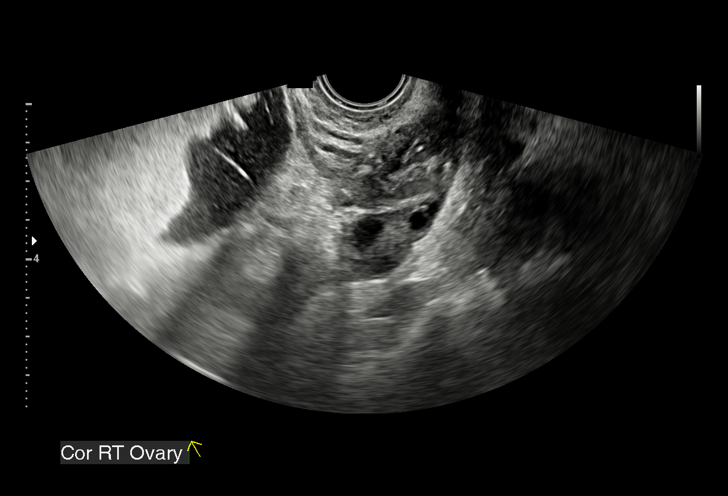
[im 63/80]
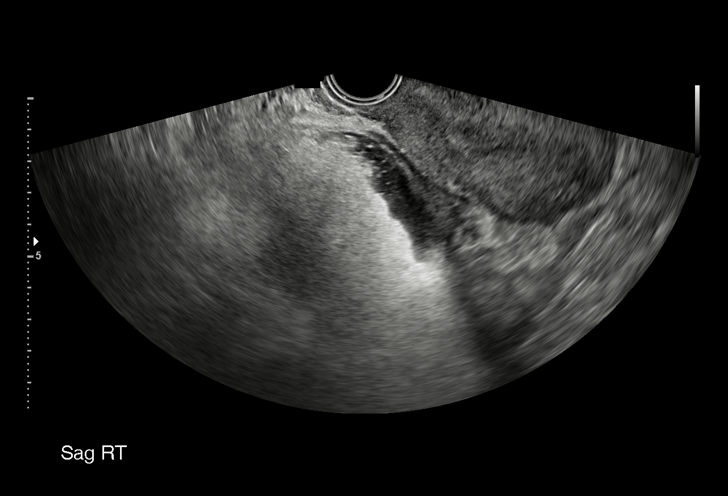
[im 66/80]
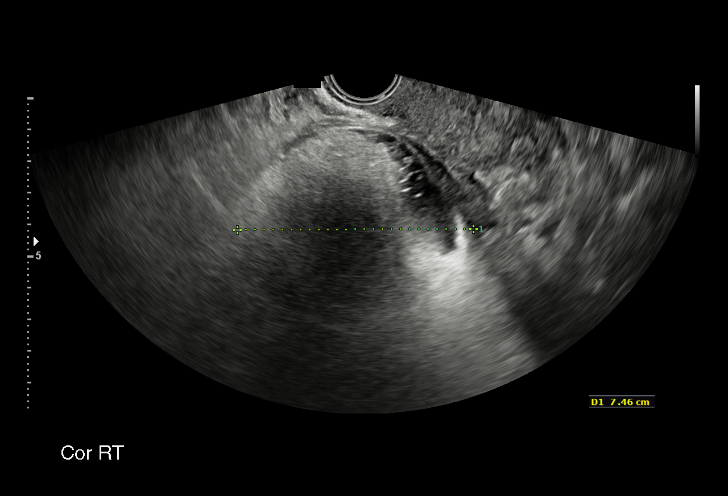
[im 73/80]
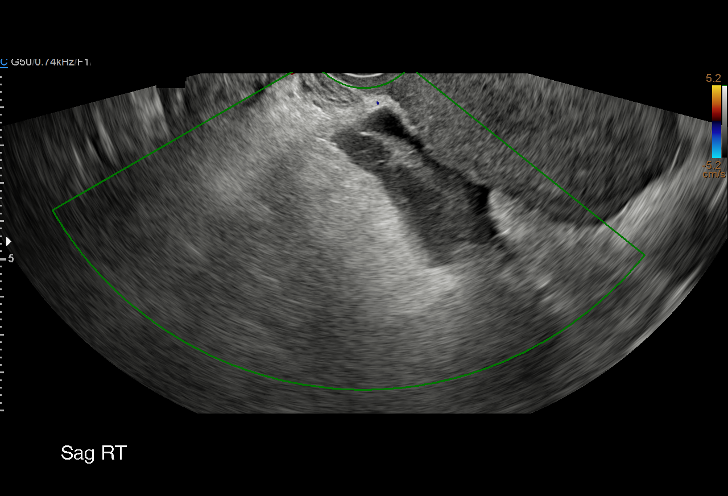
[im 80/80]
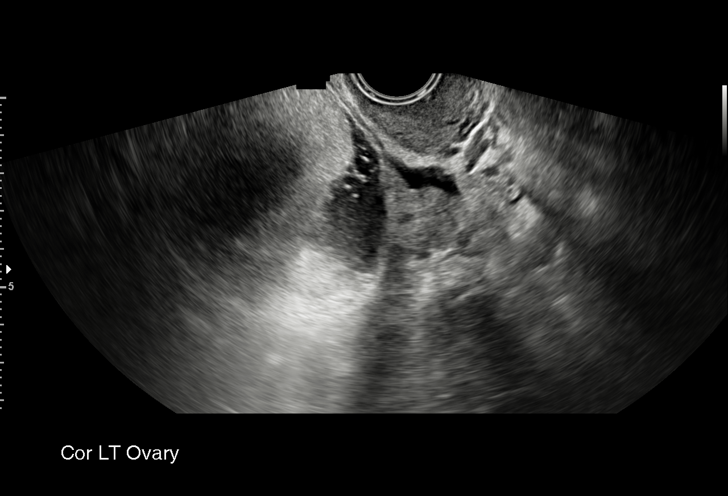

[15 of 25 positions shown; findings below may reference images not displayed]

FINDINGS: Uterus

Measurements: 7.1 x 5.1 x 6.8 cm. The uterus appears retroflexed
otherwise unremarkable.

Endometrium

Thickness: 3 mm.  The endometrium is unremarkable as visualized.

Right ovary

Measurements: 3.1 x 2.0 x 2.0 cm. There is a 7.5 x 6.7 x 8.0 cm
complex echogenic mass in the region of the right adnexa compatible
with known dermoid. This dermoid can predispose the ovary to
torsion.

Left ovary

Measurements: 3.5 x 2.6 x 1.7 cm. Normal appearance/no adnexal mass.

Other findings

No abnormal free fluid.
IMPRESSION: 1. Unremarkable uterus and ovaries.
2. Right adnexal dermoid lesion.

## 2019-02-26 IMAGING — US US ABDOMEN LIMITED
1 series · 15 of 25 positions shown · non-contrast
Comparison: None.

CLINICAL DATA: Acute right upper quadrant abdominal pain. Mid back
and upper abdominal pain for the past 3 days.

EXAM:
ULTRASOUND ABDOMEN LIMITED RIGHT UPPER QUADRANT

[Series 1: us abdomen limited · 15 of 60 slices shown]
[im 1/60]
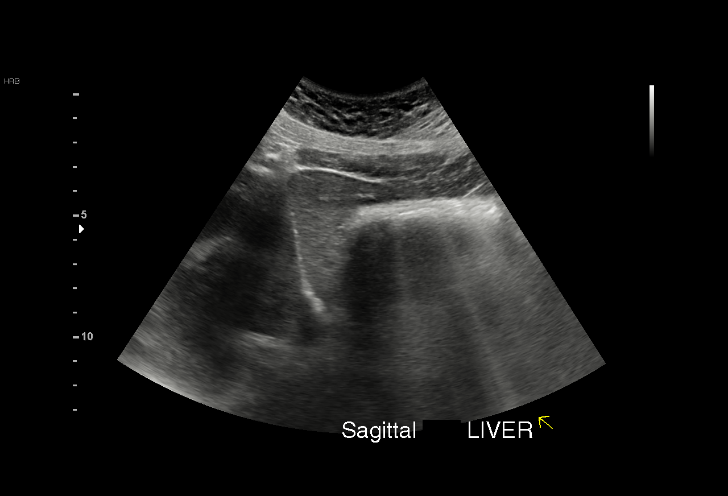
[im 5/60]
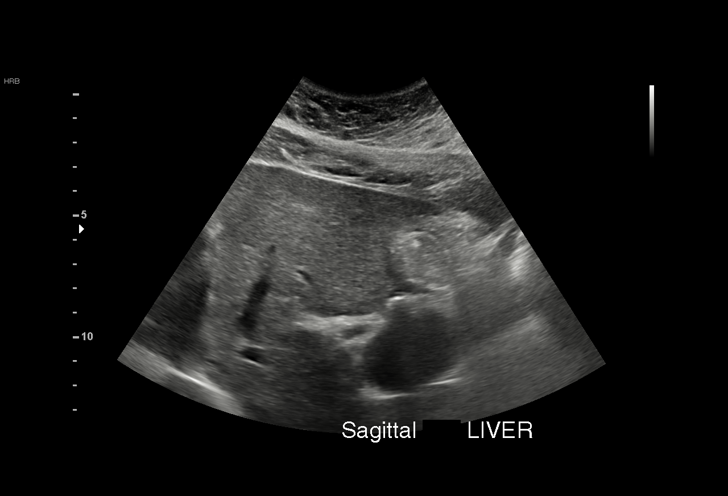
[im 10/60]
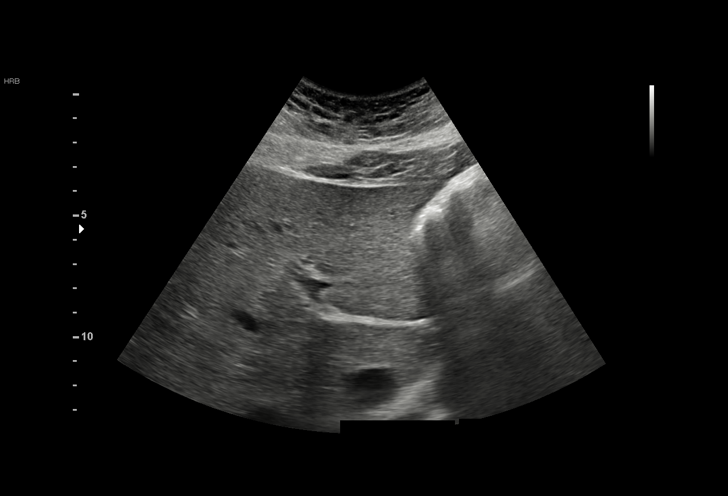
[im 13/60]
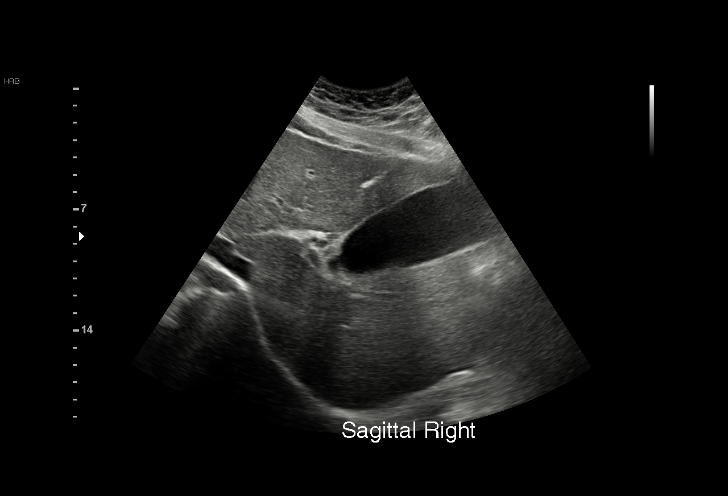
[im 18/60]
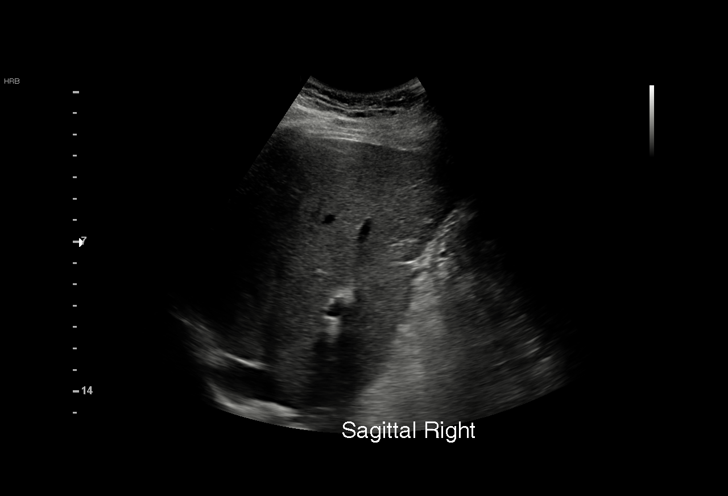
[im 23/60]
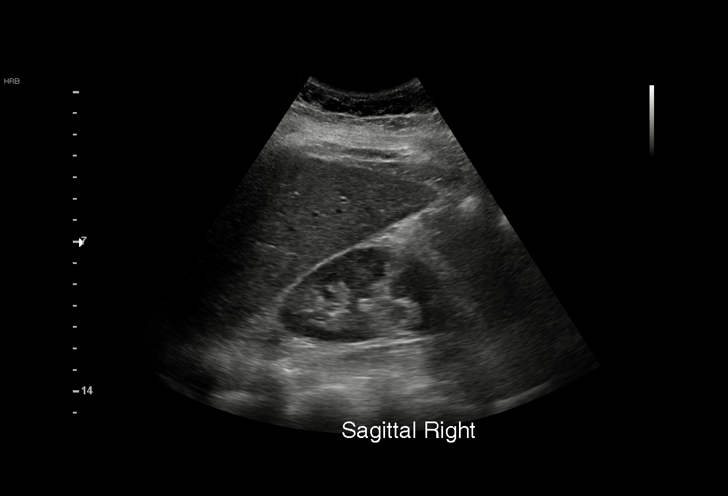
[im 25/60]
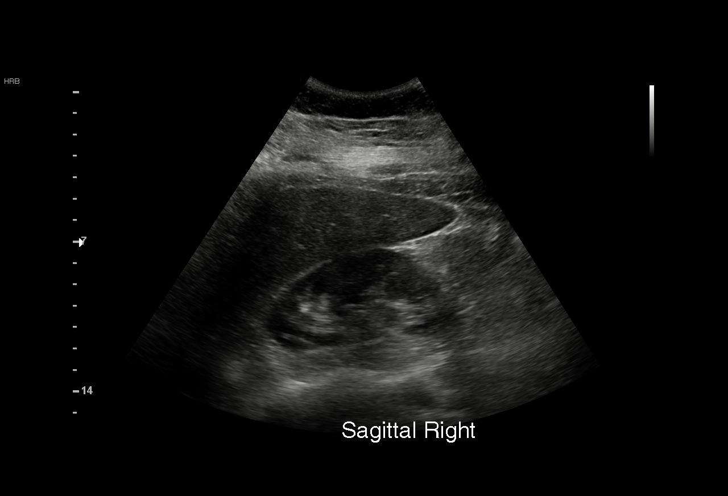
[im 30/60]
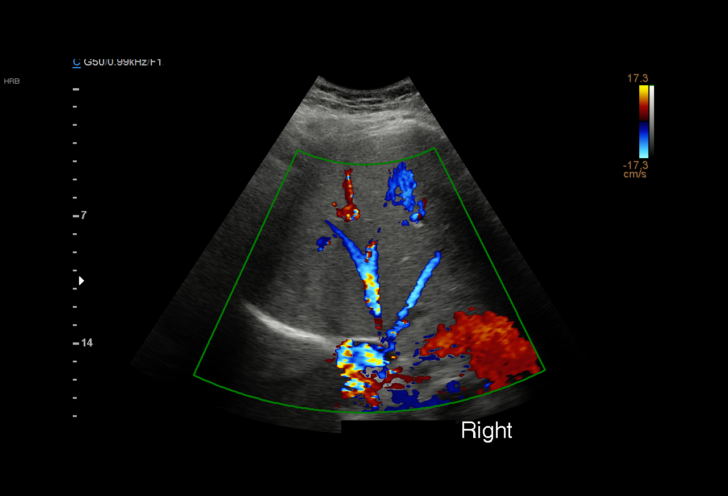
[im 35/60]
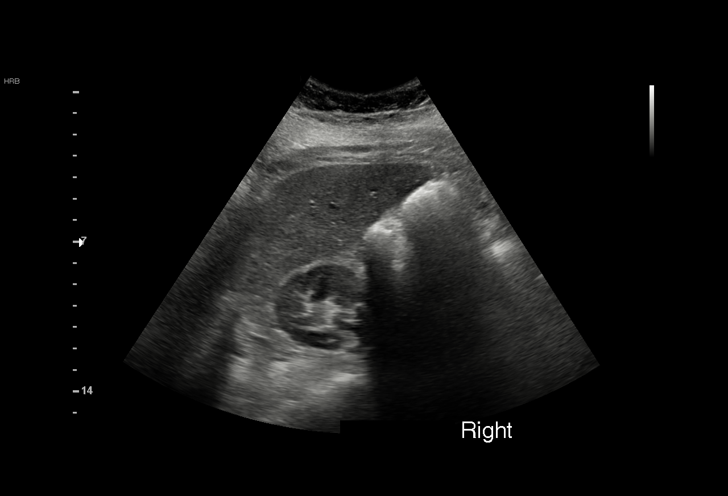
[im 37/60]
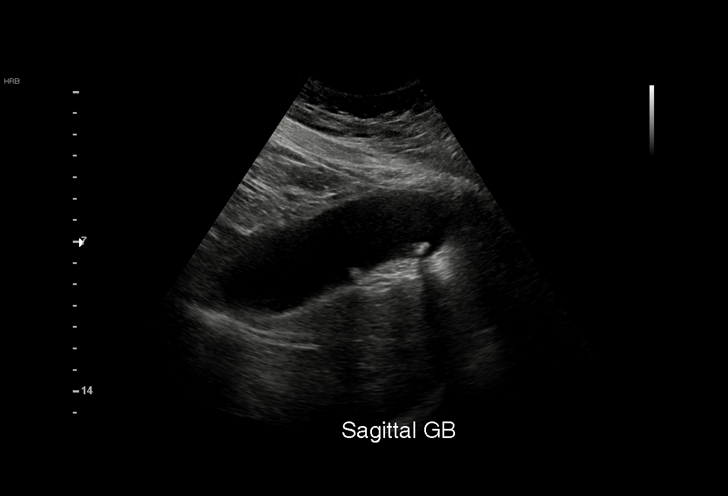
[im 42/60]
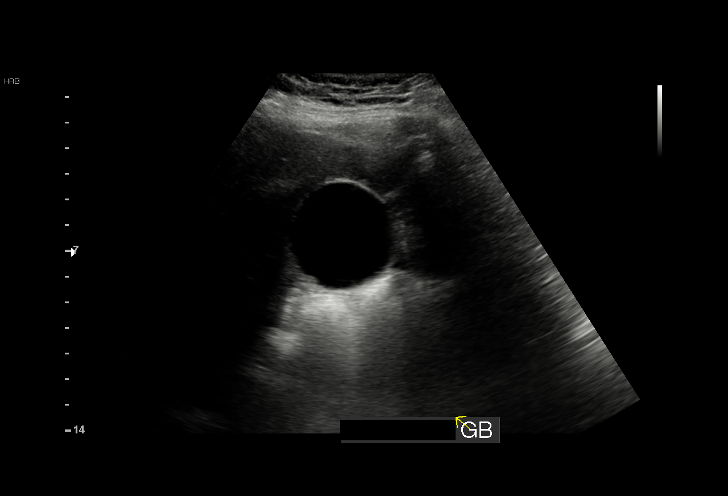
[im 47/60]
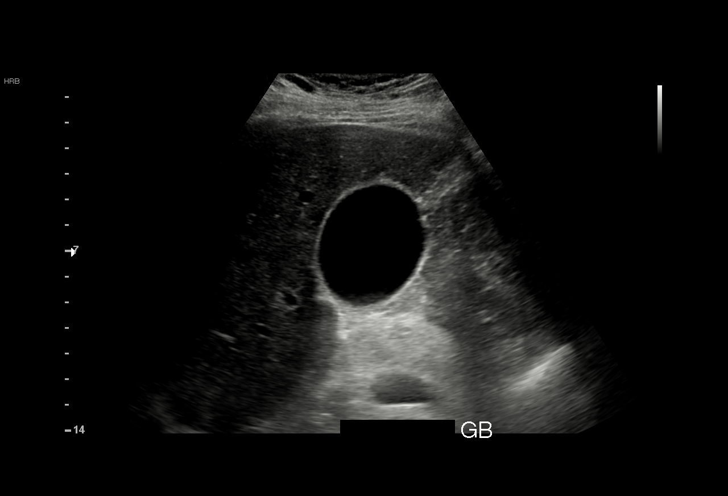
[im 50/60]
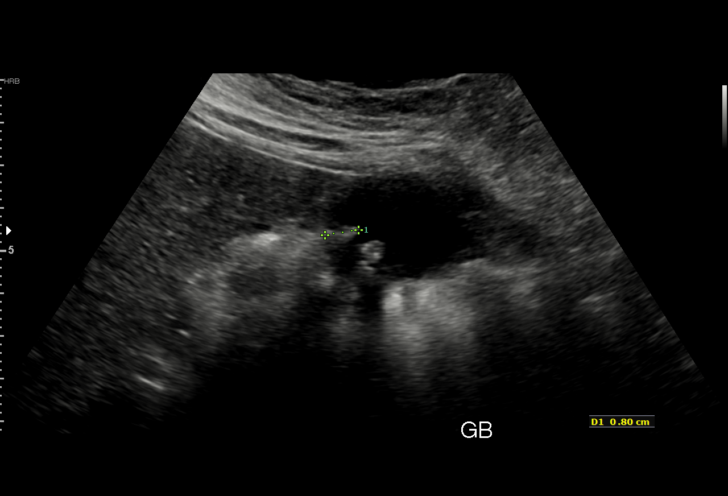
[im 55/60]
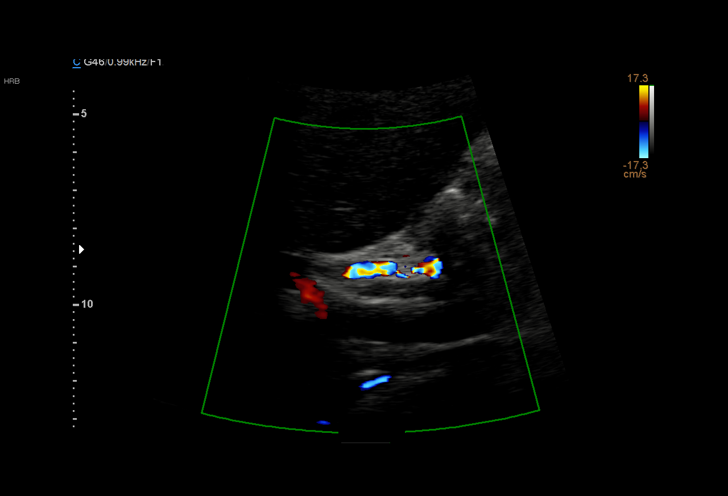
[im 60/60]
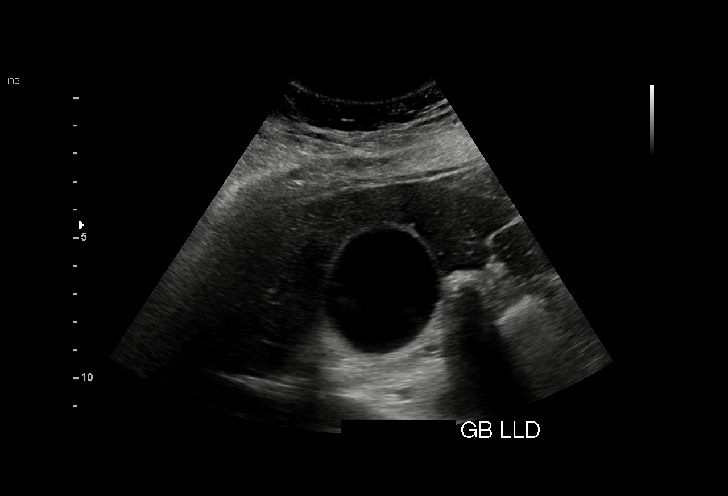

[15 of 25 positions shown; findings below may reference images not displayed]

FINDINGS: Gallbladder:

Several small, mobile gallstones in the gallbladder measuring up to
8 mm in maximum diameter each. No gallbladder wall thickening or
pericholecystic fluid. No sonographic Murphy sign.

Common bile duct:

Diameter: 4.6 mm

Liver:

No focal lesion identified. Within normal limits in parenchymal
echogenicity. Portal vein is patent on color Doppler imaging with
normal direction of blood flow towards the liver.
IMPRESSION: 1. Cholelithiasis without evidence of cholecystitis.
2. Otherwise, normal examination.

## 2019-02-27 IMAGING — RF DG CHOLANGIOGRAM OPERATIVE
1 series · 4 of 4 positions shown · non-contrast
Comparison: CT abdomen and pelvis - 06/12/2017

CLINICAL DATA: Intraoperative cholangiogram during laparoscopic
cholecystectomy.

EXAM:
INTRAOPERATIVE CHOLANGIOGRAM
FLUOROSCOPY TIME:  15 seconds (8 mGy)

[Series 1: run · 4 of 88 frames shown]
[frame 4/88]
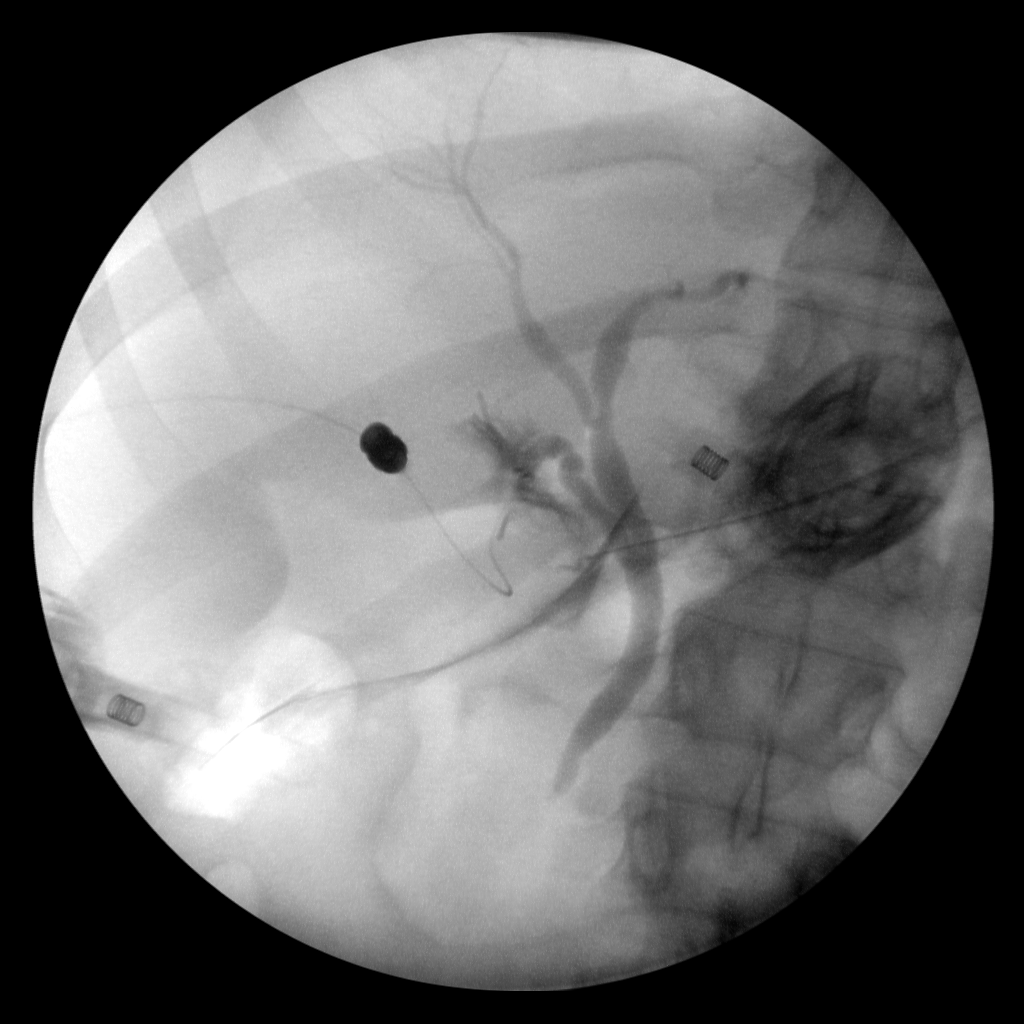
[frame 14/88]
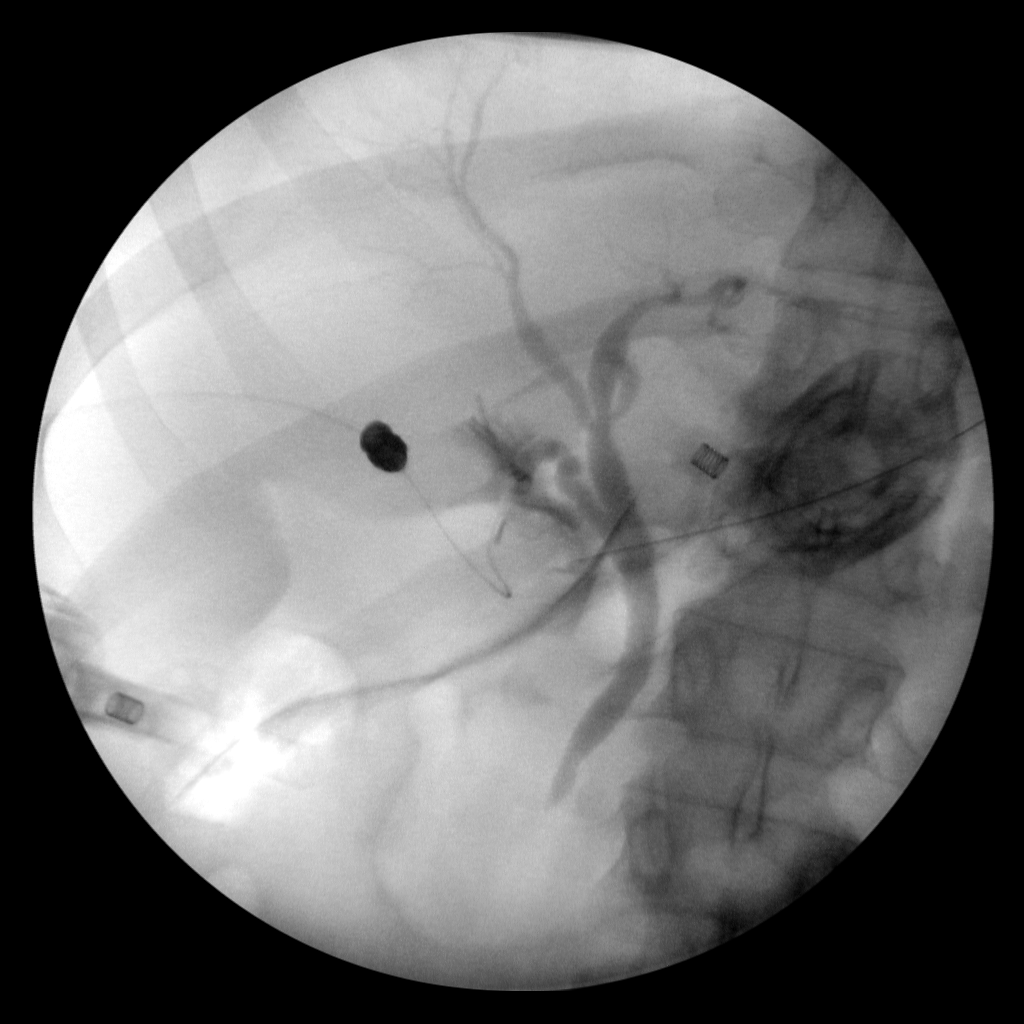
[frame 45/88]
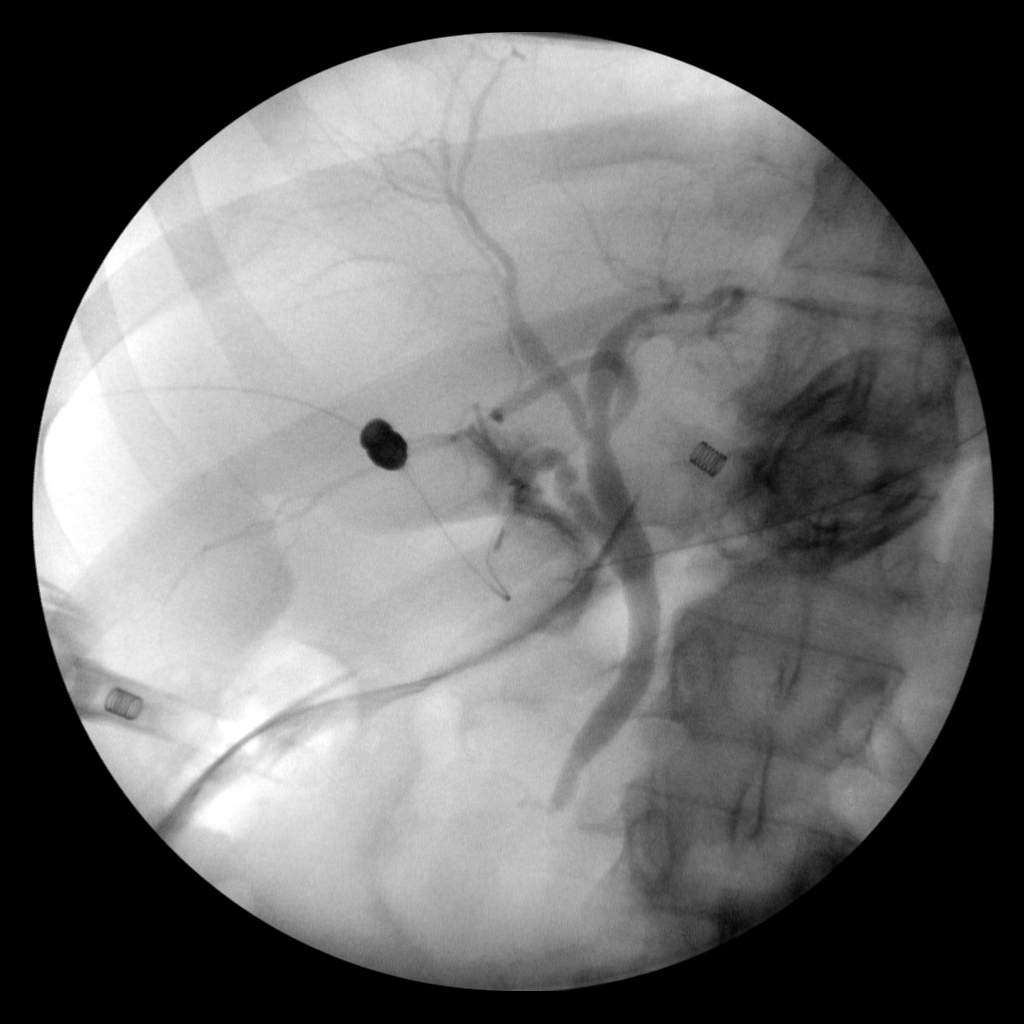
[frame 75/88]
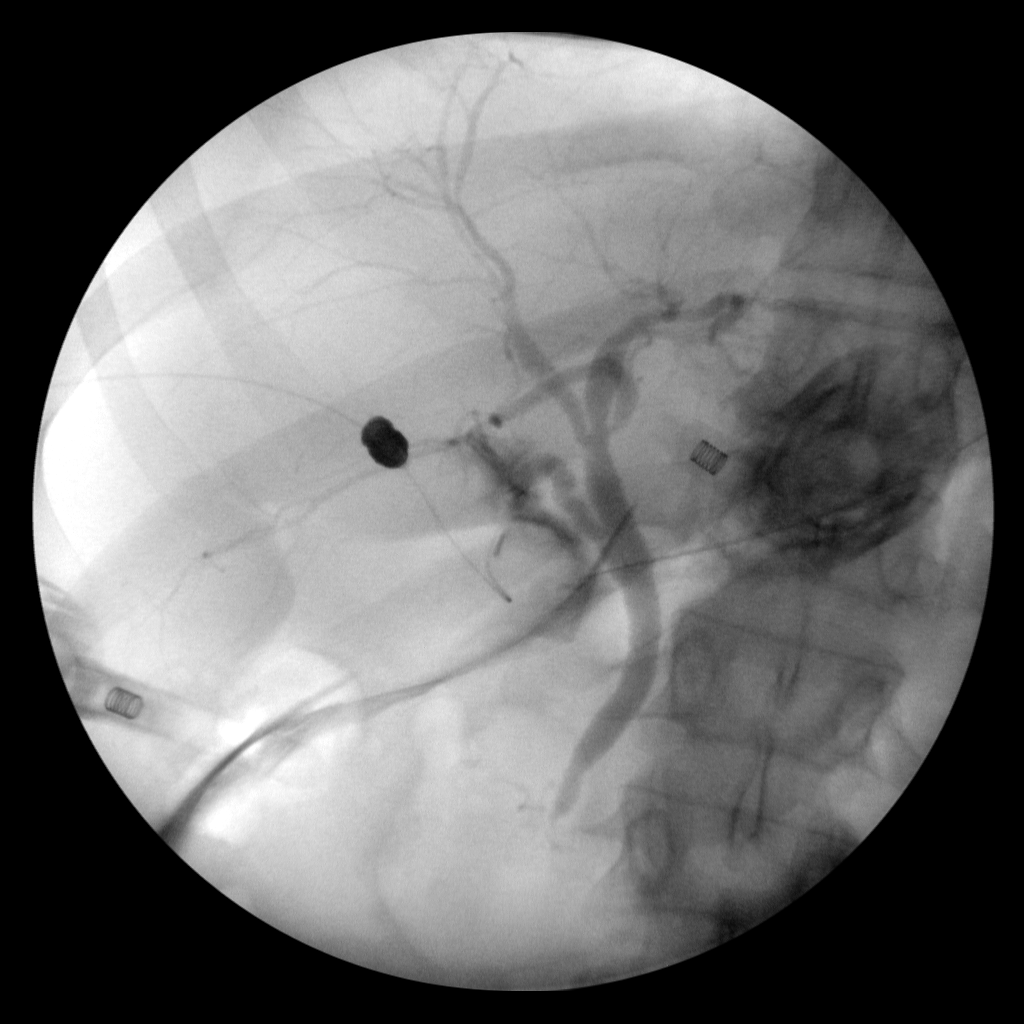

[4 of 4 positions shown; findings below may reference images not displayed]

FINDINGS: Intraoperative cholangiographic images of the right upper abdominal
quadrant during laparoscopic cholecystectomy are provided for
review.

Surgical clips overlie the expected location of the gallbladder
fossa.

Contrast injection demonstrates selective cannulation of the central
aspect of the cystic duct.

There is passage of contrast through the central aspect of the
cystic duct with filling of a non dilated common bile duct. There is
passage of contrast though the CBD and into the descending portion
of the duodenum.

There is minimal reflux of injected contrast into the common hepatic
duct and central aspect of the non dilated intrahepatic biliary
system. There is minimal contrast extravasation about the cystic
duct cannulation site.

There are no discrete filling defects within the opacified portions
of the biliary system to suggest the presence of
choledocholithiasis.
IMPRESSION: No evidence of choledocholithiasis.

## 2020-10-17 ENCOUNTER — Encounter (HOSPITAL_BASED_OUTPATIENT_CLINIC_OR_DEPARTMENT_OTHER): Payer: Self-pay

## 2020-10-17 ENCOUNTER — Emergency Department (HOSPITAL_BASED_OUTPATIENT_CLINIC_OR_DEPARTMENT_OTHER)
Admission: EM | Admit: 2020-10-17 | Discharge: 2020-10-17 | Disposition: A | Discharge status: Home / Self Care | Payer: 59 | Attending: Emergency Medicine | Admitting: Emergency Medicine

## 2020-10-17 ENCOUNTER — Other Ambulatory Visit: Payer: Self-pay

## 2020-10-17 DIAGNOSIS — Z28310 Unvaccinated for covid-19: Secondary | ICD-10-CM | POA: Insufficient documentation

## 2020-10-17 DIAGNOSIS — U071 COVID-19: Secondary | ICD-10-CM | POA: Insufficient documentation

## 2020-10-17 DIAGNOSIS — Z87891 Personal history of nicotine dependence: Secondary | ICD-10-CM | POA: Diagnosis not present

## 2020-10-17 DIAGNOSIS — J069 Acute upper respiratory infection, unspecified: Secondary | ICD-10-CM | POA: Diagnosis not present

## 2020-10-17 DIAGNOSIS — R0981 Nasal congestion: Secondary | ICD-10-CM | POA: Diagnosis present

## 2020-10-17 LAB — RESP PANEL BY RT-PCR (FLU A&B, COVID) ARPGX2
Influenza A by PCR: NEGATIVE
Influenza B by PCR: NEGATIVE
SARS Coronavirus 2 by RT PCR: POSITIVE — AB

## 2020-10-17 MED ORDER — GUAIFENESIN-DM 100-10 MG/5ML PO SYRP: 5.0000 mL | ORAL_SOLUTION | ORAL | 0 refills | Status: AC | PRN

## 2020-10-17 NOTE — ED Triage Notes (Signed)
Started Tuesday, Cough and head congestion NAD

## 2020-10-17 NOTE — Discharge Instructions (Addendum)
You have been seen here for URI like symptoms.  I have given you a prescription for a cough suppressant medication, please use as needed, do not use if you think you are pregnant or trying to become pregnant.  I also recommend Claritin and Flonase for decongestion.  You find this over-the-counter.  I recommend taking Tylenol for fever control and ibuprofen for pain control please follow dosing on the back of bottle.  I recommend staying hydrated and if you do not an appetite, I recommend soups as this will provide you with fluids and calories.  Your Covid test is pending I recommend self quarantine until you get your results back on MyChart.    If you are Covid positive you must self quarantine for 5 days starting on symptom onset, if at the end of those 5 days you are feeling better you may return back to school/work, if you continue to have symptoms you must self quarantine for additional 5 days.  I would like you to contact "post Covid care" as they will provide you with information how to manage your Covid symptoms if you are Covid positive.  Or if you are Covid negative and continue of symptoms after 1 to 2 weeks you may follow-up with your primary care provider  Come back to the emergency department if you develop chest pain, shortness of breath, severe abdominal pain, uncontrolled nausea, vomiting, diarrhea.

## 2020-10-17 NOTE — ED Provider Notes (Signed)
[facility] Provider Note   CSN: [id number] Arrival date & time: 10/17/20  1062     History Chief Complaint  Patient presents with   Cough    Sharon Browning is a 30 y.o. female.  HPI  Patient with no significant medical history presents to the emergency department with chief complaint of URI like symptoms.  Patient states it started on Tuesday, she endorses nasal congestion, subjective fevers, dry cough, but denies ear pain, headaches, sore throat, general body aches, stomach pain, nausea, vomiting, diarrhea.  Patient states she is tolerating p.o. without difficulty, patient is not vaccine against COVID or influenza, she is not immunocompromise, denies  recent sick contacts.  She denies chest pain, shortness of breath, patient denies any alleviating factors.  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   HPV test positive 04/09/2014    Past Surgical History:  Procedure Laterality Date   CHOLECYSTECTOMY N/A 06/13/2017   Procedure: LAPAROSCOPIC CHOLECYSTECTOMY WITH INTRAOPERATIVE CHOLANGIOGRAM;  Surgeon: Alphonsa Overall, MD;  Location: [facility];  Service: General;  Laterality: N/A;   LAPAROSCOPIC LYSIS OF ADHESIONS  05/17/2017   Procedure: LAPAROSCOPIC LYSIS OF ADHESIONS;  Surgeon: Crawford Givens, MD;  Location: [facility];  Service: Gynecology;;   LAPAROSCOPIC OVARIAN CYSTECTOMY Left 05/17/2017    Procedure: LAPAROSCOPIC LEFT OVARIAN DERMOID CYSTECTOMY;  Surgeon: Crawford Givens, MD;  Location: [facility];  Service: Gynecology;  Laterality: Left;     OB History     Gravida  3   Para  2   Term  2   Preterm      AB  1   Living  2      SAB  1   IAB      Ectopic      Multiple  0   Live Births  2           Family History  Problem Relation Age of Onset   Asthma Brother    Diabetes Maternal Grandmother    Hypertension Maternal Grandmother    Stroke Maternal Grandmother    Cancer Paternal Grandmother        breast    Social History   Tobacco Use   Smoking status: Former    Pack years: 0.00    Types: Cigarettes   Smokeless tobacco: Never   Tobacco comments:    Quit mid 2014  Vaping Use   Vaping Use: Never used  Substance Use Topics   Alcohol use: No   Drug use: No    Home Medications Prior to Admission medications   Medication Sig Start Date End Date Taking? Authorizing Provider  guaiFENesin-dextromethorphan (ROBITUSSIN DM) 100-10 MG/5ML syrup Take 5 mLs by mouth every 4 (four) hours as needed for up to 5 days for cough. 10/17/20 10/22/20 Yes Marcello Fennel, PA-C  HYDROcodone-acetaminophen (NORCO/VICODIN) 5-325 MG tablet Take 1-2 tablets by mouth every 4 (four) hours as needed for  moderate pain. 06/14/17   Norm Parcel, PA-C  ibuprofen (ADVIL,MOTRIN) 800 MG tablet Take 800 mg by mouth every 8 (eight) hours as needed for moderate pain.    [provider]    Allergies    Sulfa antibiotics  Review of Systems   Review of Systems  Constitutional:  Positive for fever. Negative for chills.  HENT:  Positive for congestion. Negative for sore throat.   Respiratory:  Positive for cough. Negative for shortness of breath.   Cardiovascular:  Negative for chest pain.  Gastrointestinal:  Negative for abdominal pain, diarrhea, nausea and vomiting.  Genitourinary:  Negative for enuresis.  Musculoskeletal:  Negative for myalgias.  Skin:  Negative  for rash.  Neurological:  Negative for headaches.  Hematological:  Does not bruise/bleed easily.   Physical Exam Updated Vital Signs BP 111/79 (BP Location: Left Arm)   Pulse 100   Temp 98.2 F (36.8 C) (Oral)   Resp 16   Ht 5\' 5"  (1.651 m)   Wt 90.7 kg   SpO2 99%   BMI 33.28 kg/m   Physical Exam Vitals and nursing note reviewed.  Constitutional:      General: She is not in acute distress.    Appearance: She is not ill-appearing.  HENT:     Head: Normocephalic and atraumatic.     Right Ear: Tympanic membrane, ear canal and external ear normal.     Left Ear: Tympanic membrane, ear canal and external ear normal.     Ears:     Comments: Patient noted retracted TMs bilaterally.    Nose: Congestion present.     Comments: Erythematous turbinates    Mouth/Throat:     Mouth: Mucous membranes are moist.     Pharynx: Oropharynx is clear. No oropharyngeal exudate or posterior oropharyngeal erythema.     Comments: Cobblestoning noted in the posterior pharynx Eyes:     Conjunctiva/sclera: Conjunctivae normal.  Cardiovascular:     Rate and Rhythm: Normal rate and regular rhythm.     Pulses: Normal pulses.     Heart sounds: No murmur heard.   No friction rub. No gallop.  Pulmonary:     Effort: No respiratory distress.     Breath sounds: No wheezing, rhonchi or rales.  Skin:    General: Skin is warm and dry.  Neurological:     Mental Status: She is alert.  Psychiatric:        Mood and Affect: Mood normal.    ED Results / Procedures / Treatments   Labs (all labs ordered are listed, but only abnormal results are displayed) Labs Reviewed - No data to display  EKG None  Radiology No results found.  Procedures Procedures   Medications Ordered in ED Medications - No data to display  ED Course  I have reviewed the triage vital signs and the nursing notes.  Pertinent labs & imaging results that were available during my care of the patient were reviewed by me and  considered in my medical decision making (see chart for details).    MDM Rules/Calculators/A&P                         Initial impression-patient presents with URI-like symptoms.  She is alert, does not appear in acute stress, vital signs reassuring.  Work-up-respiratory panel pending at this time  Rule out- Low suspicion for systemic infection as patient is nontoxic-appearing, vital signs reassuring, no obvious source infection noted on exam.  Low suspicion for pneumonia as lung sounds are clear bilaterally, will defer imaging at this time. I have low suspicion for PE as patient denies pleuritic chest pain, shortness of breath, patient is PERC. low suspicion for strep throat as oropharynx was visualized, no erythema or exudates noted.  Low suspicion patient would need  hospitalized due to viral infection or Covid as vital signs reassuring, patient is not in respiratory distress.    Plan-  URI-like symptoms-unfortunately patient is outside the treatment window for antivirals, will recommend symptom management , follow-up with PCP as needed.  Vital signs have remained stable, no indication for hospital admission.  Patient given at home care as well strict return precautions.  Patient verbalized that they understood agreed to said plan.  Final Clinical Impression(s) / ED Diagnoses Final diagnoses:  Viral URI with cough    Rx / DC Orders ED Discharge Orders          Ordered    guaiFENesin-dextromethorphan (ROBITUSSIN DM) 100-10 MG/5ML syrup  Every 4 hours PRN        10/17/20 1030             Aron Baba 10/17/20 1128    Gareth Morgan, MD 10/18/20 [phone]
